# Patient Record
Sex: Female | Born: 1980
Health system: Southern US, Community
[De-identification: ages and names within clinical notes are randomized; demographics above are authoritative.]

## PROBLEM LIST (undated history)

## (undated) DIAGNOSIS — G43909 Migraine, unspecified, not intractable, without status migrainosus: Secondary | ICD-10-CM

## (undated) DIAGNOSIS — F419 Anxiety disorder, unspecified: Secondary | ICD-10-CM

## (undated) DIAGNOSIS — IMO0002 Reserved for concepts with insufficient information to code with codable children: Secondary | ICD-10-CM

## (undated) DIAGNOSIS — O24419 Gestational diabetes mellitus in pregnancy, unspecified control: Secondary | ICD-10-CM

## (undated) HISTORY — DX: Migraine, unspecified, not intractable, without status migrainosus: G43.909

## (undated) HISTORY — DX: Gestational diabetes mellitus in pregnancy, unspecified control: O24.419

## (undated) HISTORY — PX: WISDOM TOOTH EXTRACTION: SHX21

## (undated) HISTORY — DX: Reserved for concepts with insufficient information to code with codable children: IMO0002

---
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
## 2002-10-05 ENCOUNTER — Emergency Department (HOSPITAL_COMMUNITY): Admission: EM | Admit: 2002-10-05 | Discharge: 2002-10-06 | Payer: Self-pay | Admitting: Emergency Medicine

## 2002-10-05 ENCOUNTER — Encounter: Payer: Self-pay | Admitting: Emergency Medicine

## 2003-01-19 ENCOUNTER — Encounter: Payer: Self-pay | Admitting: *Deleted

## 2003-01-19 ENCOUNTER — Encounter: Payer: Self-pay | Admitting: Emergency Medicine

## 2003-01-19 ENCOUNTER — Emergency Department (HOSPITAL_COMMUNITY): Admission: EM | Admit: 2003-01-19 | Discharge: 2003-01-19 | Payer: Self-pay

## 2003-10-30 ENCOUNTER — Emergency Department (HOSPITAL_COMMUNITY): Admission: EM | Admit: 2003-10-30 | Discharge: 2003-10-30 | Payer: Self-pay | Admitting: Emergency Medicine

## 2003-11-12 ENCOUNTER — Emergency Department (HOSPITAL_COMMUNITY): Admission: EM | Admit: 2003-11-12 | Discharge: 2003-11-12 | Payer: Self-pay | Admitting: *Deleted

## 2003-12-06 ENCOUNTER — Emergency Department (HOSPITAL_COMMUNITY): Admission: EM | Admit: 2003-12-06 | Discharge: 2003-12-06 | Payer: Self-pay | Admitting: Emergency Medicine

## 2004-01-18 ENCOUNTER — Inpatient Hospital Stay (HOSPITAL_COMMUNITY): Admission: AD | Admit: 2004-01-18 | Discharge: 2004-01-19 | Payer: Self-pay | Admitting: Obstetrics and Gynecology

## 2004-01-22 ENCOUNTER — Emergency Department (HOSPITAL_COMMUNITY): Admission: EM | Admit: 2004-01-22 | Discharge: 2004-01-22 | Payer: Self-pay | Admitting: Emergency Medicine

## 2004-01-24 ENCOUNTER — Emergency Department (HOSPITAL_COMMUNITY): Admission: EM | Admit: 2004-01-24 | Discharge: 2004-01-24 | Payer: Self-pay

## 2004-02-27 ENCOUNTER — Emergency Department (HOSPITAL_COMMUNITY): Admission: EM | Admit: 2004-02-27 | Discharge: 2004-02-27 | Payer: Self-pay | Admitting: Emergency Medicine

## 2004-04-13 ENCOUNTER — Inpatient Hospital Stay (HOSPITAL_COMMUNITY): Admission: AD | Admit: 2004-04-13 | Discharge: 2004-04-13 | Payer: Self-pay | Admitting: *Deleted

## 2004-05-01 ENCOUNTER — Inpatient Hospital Stay (HOSPITAL_COMMUNITY): Admission: AD | Admit: 2004-05-01 | Discharge: 2004-05-01 | Payer: Self-pay | Admitting: Obstetrics

## 2004-05-08 ENCOUNTER — Inpatient Hospital Stay (HOSPITAL_COMMUNITY): Admission: AD | Admit: 2004-05-08 | Discharge: 2004-05-09 | Payer: Self-pay | Admitting: Obstetrics

## 2004-06-13 ENCOUNTER — Inpatient Hospital Stay (HOSPITAL_COMMUNITY): Admission: AD | Admit: 2004-06-13 | Discharge: 2004-06-13 | Payer: Self-pay | Admitting: *Deleted

## 2004-10-18 ENCOUNTER — Inpatient Hospital Stay (HOSPITAL_COMMUNITY): Admission: AD | Admit: 2004-10-18 | Discharge: 2004-10-19 | Payer: Self-pay | Admitting: Obstetrics

## 2004-11-03 ENCOUNTER — Inpatient Hospital Stay (HOSPITAL_COMMUNITY): Admission: AD | Admit: 2004-11-03 | Discharge: 2004-11-03 | Payer: Self-pay | Admitting: Obstetrics

## 2004-12-10 ENCOUNTER — Inpatient Hospital Stay (HOSPITAL_COMMUNITY): Admission: AD | Admit: 2004-12-10 | Discharge: 2004-12-13 | Payer: Self-pay | Admitting: Obstetrics

## 2004-12-10 ENCOUNTER — Inpatient Hospital Stay (HOSPITAL_COMMUNITY): Admission: AD | Admit: 2004-12-10 | Discharge: 2004-12-10 | Payer: Self-pay | Admitting: Obstetrics

## 2005-10-23 ENCOUNTER — Inpatient Hospital Stay (HOSPITAL_COMMUNITY): Admission: AD | Admit: 2005-10-23 | Discharge: 2005-10-23 | Payer: Self-pay | Admitting: Gynecology

## 2005-10-29 ENCOUNTER — Emergency Department (HOSPITAL_COMMUNITY): Admission: EM | Admit: 2005-10-29 | Discharge: 2005-10-29 | Payer: Self-pay | Admitting: Emergency Medicine

## 2006-01-01 ENCOUNTER — Inpatient Hospital Stay (HOSPITAL_COMMUNITY): Admission: AD | Admit: 2006-01-01 | Discharge: 2006-01-01 | Payer: Self-pay | Admitting: Obstetrics and Gynecology

## 2006-01-10 ENCOUNTER — Other Ambulatory Visit: Admission: RE | Admit: 2006-01-10 | Discharge: 2006-01-10 | Payer: Self-pay | Admitting: Obstetrics and Gynecology

## 2006-02-18 ENCOUNTER — Emergency Department (HOSPITAL_COMMUNITY): Admission: EM | Admit: 2006-02-18 | Discharge: 2006-02-18 | Payer: Self-pay | Admitting: Emergency Medicine

## 2006-05-24 ENCOUNTER — Inpatient Hospital Stay (HOSPITAL_COMMUNITY): Admission: AD | Admit: 2006-05-24 | Discharge: 2006-05-26 | Payer: Self-pay | Admitting: Obstetrics and Gynecology

## 2007-11-13 ENCOUNTER — Emergency Department (HOSPITAL_COMMUNITY): Admission: EM | Admit: 2007-11-13 | Discharge: 2007-11-13 | Payer: Self-pay | Admitting: Emergency Medicine

## 2007-11-30 DIAGNOSIS — IMO0002 Reserved for concepts with insufficient information to code with codable children: Secondary | ICD-10-CM

## 2007-11-30 HISTORY — DX: Reserved for concepts with insufficient information to code with codable children: IMO0002

## 2008-02-11 ENCOUNTER — Other Ambulatory Visit: Admission: RE | Admit: 2008-02-11 | Discharge: 2008-02-11 | Payer: Self-pay | Admitting: Gynecology

## 2008-03-08 ENCOUNTER — Ambulatory Visit: Payer: Self-pay | Admitting: Gynecology

## 2008-03-08 HISTORY — PX: OTHER SURGICAL HISTORY: SHX169

## 2008-03-09 ENCOUNTER — Ambulatory Visit: Payer: Self-pay | Admitting: Gynecology

## 2008-12-28 ENCOUNTER — Ambulatory Visit: Payer: Self-pay | Admitting: Gynecology

## 2009-02-13 ENCOUNTER — Ambulatory Visit: Payer: Self-pay | Admitting: Gynecology

## 2009-02-13 ENCOUNTER — Encounter: Payer: Self-pay | Admitting: Gynecology

## 2009-02-13 ENCOUNTER — Other Ambulatory Visit: Admission: RE | Admit: 2009-02-13 | Discharge: 2009-02-13 | Payer: Self-pay | Admitting: Gynecology

## 2009-07-05 ENCOUNTER — Emergency Department (HOSPITAL_COMMUNITY): Admission: EM | Admit: 2009-07-05 | Discharge: 2009-07-05 | Payer: Self-pay | Admitting: Family Medicine

## 2010-02-15 ENCOUNTER — Ambulatory Visit: Payer: Self-pay | Admitting: Gynecology

## 2010-02-15 ENCOUNTER — Other Ambulatory Visit: Admission: RE | Admit: 2010-02-15 | Discharge: 2010-02-15 | Payer: Self-pay | Admitting: Gynecology

## 2011-02-20 ENCOUNTER — Ambulatory Visit (INDEPENDENT_AMBULATORY_CARE_PROVIDER_SITE_OTHER): Payer: BC Managed Care – PPO | Admitting: Women's Health

## 2011-02-20 ENCOUNTER — Other Ambulatory Visit (HOSPITAL_COMMUNITY)
Admission: RE | Admit: 2011-02-20 | Discharge: 2011-02-20 | Disposition: A | Discharge status: Home / Self Care | Payer: BC Managed Care – PPO | Source: Ambulatory Visit | Attending: Women's Health | Admitting: Women's Health

## 2011-02-20 ENCOUNTER — Encounter: Payer: Self-pay | Admitting: Women's Health

## 2011-02-20 VITALS — BP 110/70 | Ht 64.0 in | Wt 138.0 lb

## 2011-02-20 DIAGNOSIS — Z833 Family history of diabetes mellitus: Secondary | ICD-10-CM

## 2011-02-20 DIAGNOSIS — R82998 Other abnormal findings in urine: Secondary | ICD-10-CM

## 2011-02-20 DIAGNOSIS — R87810 Cervical high risk human papillomavirus (HPV) DNA test positive: Secondary | ICD-10-CM | POA: Insufficient documentation

## 2011-02-20 DIAGNOSIS — Z01419 Encounter for gynecological examination (general) (routine) without abnormal findings: Secondary | ICD-10-CM

## 2011-02-20 DIAGNOSIS — Z113 Encounter for screening for infections with a predominantly sexual mode of transmission: Secondary | ICD-10-CM

## 2011-02-20 DIAGNOSIS — Z124 Encounter for screening for malignant neoplasm of cervix: Secondary | ICD-10-CM | POA: Insufficient documentation

## 2011-02-20 NOTE — Progress Notes (Signed)
Misty Russell 1980/09/22 [id number]    History:    The patient presents for annual exam.  Works at the [employer] no complaints today.   Past medical history, past surgical history, family history and social history were all reviewed and documented in the EPIC chart.   ROS:  A  ROS was performed and pertinent positives and negatives are included in the history.  Exam:  Filed Vitals:   02/20/11 1556  BP: 110/70    General appearance:  Normal Head/Neck:  Normal, without cervical or supraclavicular adenopathy. Thyroid:  Symmetrical, normal in size, without palpable masses or nodularity. Respiratory  Effort:  Normal  Auscultation:  Clear without wheezing or rhonchi Cardiovascular  Auscultation:  Regular rate, without rubs, murmurs or gallops  Edema/varicosities:  Not grossly evident Abdominal  Soft,nontender, without masses, guarding or rebound.  Liver/spleen:  No organomegaly noted  Hernia:  None appreciated  Skin  Inspection:  Grossly normal  Palpation:  Grossly normal/implanon-rt upper arm Neurologic/psychiatric  Orientation:  Normal with appropriate conversation.  Mood/affect:  Normal  Genitourinary    Breasts: Examined lying and sitting.     Right: Without masses, retractions, discharge or axillary adenopathy.     Left: Without masses, retractions, discharge or axillary adenopathy.   Inguinal/mons:  Normal without inguinal adenopathy  External genitalia:  Normal  BUS/Urethra/Skene's glands:  Normal  Bladder:  Normal  Vagina:  Normal  Cervix:  Normal  Uterus:   normal in size, shape and contour.  Midline and mobile  Adnexa/parametria:     Rt: Without masses or tenderness.   Lt: Without masses or tenderness.  Anus and perineum: Normal  Digital rectal exam: Normal sphincter tone without palpated masses or tenderness  Assessment/Plan:  30 y.o. Separated BF G2P2 for annual exam with no complaints. Implanon was placed September 15 09, monthly 6 day cycle.  Contraception options were reviewed, would like to try to Mirena IUD. Reviewed risks of infection perforation hemorrhage, will check insurance coverage and schedule with Dr. Lily Peer to have placed and to remove the Implanon.  She has a new partner.  CBC, glucose, UA, Pap, GC/ Chlamydia, HIV, hepatitis B and C., and and RPR. SBEs, exercise, MVI, calcium rich diet encouraged. Will return to the office for Mirena IUD and Implanon removal with Dr. Lily Peer.    Harrington Challenger [facility], 5:08 PM 02/20/2011

## 2011-02-21 ENCOUNTER — Telehealth: Payer: Self-pay | Admitting: Women's Health

## 2011-02-21 LAB — HEPATITIS C ANTIBODY: HCV Ab: NEGATIVE

## 2011-02-21 LAB — RPR

## 2011-02-21 LAB — HEPATITIS B SURFACE ANTIGEN: Hepatitis B Surface Ag: NEGATIVE

## 2011-02-21 NOTE — Telephone Encounter (Signed)
Left message on cell that all her labs were normal or negative. Hemoglobin/ hematocrit slightly low 11.9 and 34.7. Did review importance of woman's One-A-Day vitamin, increasing iron rich foods. Instructed to call if questions.

## 2011-02-27 ENCOUNTER — Ambulatory Visit: Payer: BC Managed Care – PPO | Admitting: Gynecology

## 2011-02-28 ENCOUNTER — Telehealth: Payer: Self-pay | Admitting: Women's Health

## 2011-02-28 NOTE — Telephone Encounter (Signed)
Patient was notified about Pap with ASCUS positive high-risk HPV, C&B.was scheduled.

## 2011-03-06 ENCOUNTER — Encounter: Payer: Self-pay | Admitting: Women's Health

## 2011-03-07 ENCOUNTER — Ambulatory Visit: Payer: BC Managed Care – PPO | Admitting: Gynecology

## 2011-03-08 ENCOUNTER — Ambulatory Visit: Payer: BC Managed Care – PPO | Admitting: Gynecology

## 2011-03-08 ENCOUNTER — Encounter: Payer: Self-pay | Admitting: Gynecology

## 2011-03-08 ENCOUNTER — Ambulatory Visit (INDEPENDENT_AMBULATORY_CARE_PROVIDER_SITE_OTHER): Payer: BC Managed Care – PPO | Admitting: Gynecology

## 2011-03-08 VITALS — BP 116/72

## 2011-03-08 DIAGNOSIS — Z30431 Encounter for routine checking of intrauterine contraceptive device: Secondary | ICD-10-CM

## 2011-03-08 DIAGNOSIS — Z3046 Encounter for surveillance of implantable subdermal contraceptive: Secondary | ICD-10-CM

## 2011-03-08 DIAGNOSIS — Z3049 Encounter for surveillance of other contraceptives: Secondary | ICD-10-CM

## 2011-03-08 HISTORY — PX: INTRAUTERINE DEVICE INSERTION: SHX323

## 2011-03-08 MED ORDER — LEVONORGESTREL 20 MCG/24HR IU IUD: INTRAUTERINE_SYSTEM | Freq: Once | INTRAUTERINE | Status: DC

## 2011-03-08 NOTE — Progress Notes (Signed)
Patient is a 30 year old gravida 2 para 2 who presented to the office today to remove her Implanon from her left arm which was placed approximately 3 years ago. Patient was interested in proceeding with a different form of contraception such as with a Mirena IUD which she had received read the information which she was last here in the office. Consent form was signed today. The risks benefits and pros and cons of the Mirena IUD were discussed. Patient fully understands that this form of contraception is good for 5 years and is 99% effective and reversible.  Procedure #1 removal of Implanon The patient was in the supine position with her left arm at 90 identification of the previously placed Implanon was noted. The site for the incision was to be made Betadine solution was applied as an antiseptic. One percent lidocaine was infiltrated under the implant to keep a close to the skin surface. Countertraction on the proximal end of the implant was made in a longitudinal incision of 2 mm from the tip of the implant towards the elbow was made. Pushing the implant towards the incision was made until the tip was visible. The implant was then grasped with a forcep (hemostat) and gently the implant was removed. The implant was inspected and was 4 cm long shown to the patient and discarded. The incision was then closed with a Steri-Strip and a Kerlix wrap was placed for patient to remove tomorrow.  Procedure #2 insertion of Mirena IUD The patient was then stirrups and a bimanual examination demonstrated a uterus to be anteverted normal size shape and consistency with no palpable adnexal masses. The vagina and cervix was cleansed with Betadine solution. A single-tooth tenaculum was placed on the anterior cervical lip. The uterus sounded to 7 cm. The Mirena IUD was placed in a sterile fashion. The single-tooth tenaculum was removed and the speculum.  Patient tolerated both procedures well. She will return to the office  in one month for followup.

## 2011-03-11 ENCOUNTER — Encounter: Payer: Self-pay | Admitting: Gynecology

## 2011-03-12 ENCOUNTER — Ambulatory Visit (INDEPENDENT_AMBULATORY_CARE_PROVIDER_SITE_OTHER): Payer: BC Managed Care – PPO | Admitting: Gynecology

## 2011-03-12 ENCOUNTER — Encounter: Payer: Self-pay | Admitting: Gynecology

## 2011-03-12 VITALS — BP 110/70

## 2011-03-12 DIAGNOSIS — R87610 Atypical squamous cells of undetermined significance on cytologic smear of cervix (ASC-US): Secondary | ICD-10-CM

## 2011-03-12 DIAGNOSIS — R87611 Atypical squamous cells cannot exclude high grade squamous intraepithelial lesion on cytologic smear of cervix (ASC-H): Secondary | ICD-10-CM

## 2011-03-12 DIAGNOSIS — Z30431 Encounter for routine checking of intrauterine contraceptive device: Secondary | ICD-10-CM

## 2011-03-12 NOTE — Progress Notes (Signed)
Patient is a 30 year old who on August 30 at her Pap smear which had demonstrated atypical squamous cells of undetermined significance high-risk HPV had been detected. Patient did in the office 4 days ago and had the Implanon removed from her arm in a Mirena IUD had been placed. We went through a detailed discussion of the diagnosis and management of abnormal Pap smears and new guidelines.  Colposcopic evaluation:  Bartholin urethra Skene was within normal limits Near the area of the fourchette there appears to be a condylomatous-like growth Cervix: IUD string was seen and was trimmed. The area where the single-tooth tenaculum was placed to 4 days ago began to bleed and silver nitrate was placed to contain it which obscured the cervix from complete evaluation.  We'll have the patient return back in one month for the completion of the colposcopic evaluation and possible consideration of biopsying the are of the fourchette as well and manage accordingly. All this was going to the patient. Literature information was provided.

## 2011-03-12 NOTE — Patient Instructions (Signed)
Patient information: Management of atypical squamous cells (ASC-US and ASC-H) and low grade cervical squamous intraepithelial lesions (LSIL) (Beyond the Basics)  Authors Clydene Pugh, MD Thayer Ohm, MD Section Editor Alvera Novel, MD Deputy Editor Morton Amy, MD Disclosures  All topics are updated as new evidence becomes available and our peer review process is complete.  Literature review current through: Aug 2012.  This topic last updated: Oct 03, 2010.  INTRODUCTION - Squamous cells make up the outer layer of the cervix and vagina). Atypical squamous cells (ASC) is the name given to squamous cells on a Pap smear or cervical cytology that do not have a normal appearance but are not clearly precancerous. Low grade squamous intraepithelial lesions (LSIL, also called low grade cervical intraepithelial neoplasia) refers to cells that appear slightly abnormal. Women who have ASC or LSIL require further testing because some women with these findings have a precancerous lesion of the cervix. This topic review discusses the management of women with ASC and LSIL. The management of women with high grade squamous intraepithelial lesions (HSIL) and atypical glandular cells (AGC) are discussed in a separate topic review.Marland Kitchen) ATYPICAL SQUAMOUS CELLS (ASC) - ASC is subdivided into atypical squamous cells of undetermined significance (ASC-US) and atypical squamous cells, cannot rule out a high grade lesion (ASC-H). The risk of a high-grade precancerous lesion in women with ASC-US is 15 percent and for those with ASC-H, the risk is 38 percent [1]. Atypical squamous cells of undetermined significance (ASC-US) - In women older than 20 years, there are three options for evaluation of a single ASC-US result. Women who are pregnant or younger than age 55 years are evaluated differently (see 'Adolescents' below and 'Pregnant women' below). Perform HPV testing. This is the preferred follow up for  ASC-US. HPV testing is often done at the same time as the Pap smear. This is convenient because the woman does not have to return for a second visit. HPV testing is described in detail in a separate topic review Women who test positive for HPV types that are high risk for cervical cancer should have colposcopy because they are at greater risk of having an underlying precancerous lesion.  Women who test negative for HPV are not likely to have cervical precancer. These women should have a repeat Pap smear in one year. In most cases, the ASC-US resolves during this time.  Repeat the Pap smear in six months. If this test is normal, it is repeated once more after another six months until there have been two normal tests in a row; the woman can then return to routine screening. If the woman has a second ASC-US result or a more severe abnormality develops, colposcopy is recommended.   Have colposcopy. Atypical squamous cells, cannot rule out a high grade lesion (ASC-H) - ASC-H is more likely than ASC-US to be caused by a precancerous change. This finding requires further evaluation with colposcopy LOW-GRADE SQUAMOUS LESION (LSIL) - LSIL is usually caused by mild cellular changes. Further testing with colposcopy and cervical biopsy is almost always recommended for women with LSIL because 12 to 16 percent of women with LSIL have a precancerous lesion [2,3]. However, adolescents and postmenopausal women are evaluated somewhat differently  Pregnant women are evaluated similarly to non-pregnant women but are also discussed separately below. The management of women with LSIL depends upon what is seen with colposcopy and biopsymost clinicians will delay biopsy until after delivery in pregnant women COLPOSCOPY - Colposcopy is an office procedure that  allows a clinician to closely examine the cervix. It is commonly performed after an abnormal Pap smear. Colposcopy is performed similar to a pelvic examination, while the  woman lies on an exam table. A speculum is used to view the cervix, and the viewing device (called a colposcope) remains outside the woman's body. The colposcope magnifies the appearance of the cervix. This allows the clinician to better see the location and size of any abnormalities, and also to see any changes in the capillaries (small blood vessels) on the surface of the cervix.  During colposcopy, a small piece of the abnormal area can be removed (biopsied). Anesthesia (numbing medicine) is not needed because the biopsy causes only mild discomfort or cramping. Some women also need to have a biopsy of the inner cervix during colposcopy; this is called endocervical curettage (ECC). Endocervix refers to the inner cervix and curettage means scraping. Pregnant women should not have ECC because it may disturb the pregnancy. Management after colposcopy - Most women who have a colposcopy have a biopsy of any abnormal-appearing areas. The biopsy samples are sent to a pathologist who determines if there is any evidence of precancerous changes, termed cervical intraepithelial neoplasia (CIN). These changes are categorized as being mild (CIN 1) or moderate to severe (CIN 2 or 3). CIN 1 biopsy in women with Pap smear results that were ASC-US, ASC-H or LSIL cytology - Follow-up is recommended with either HPV testing at 12 months or a Pap smear at 6 and 12 months. The reason for this recommendation is that CIN I is a minor abnormality that usually goes away over time without treatment. Waiting and repeating testing allows time for the abnormality to resolve and also enables the healthcare provider to identify the few situations in which the abnormality has become more severe. Repeat colposcopy is recommended if the results of the follow-up Pap smear are ASC or greater or if the HPV test is positive. Women with two consecutive negative repeat cytology results or a negative HPV test can resume routine screening.  CIN  biopsy in women with Pap smear results that were high-grade SIL (HSIL) or atypical glandular cells-not otherwise specified - Follow-up can be one of three options: (1) Pap smear and colposcopy every six months for a year; (2) re-review of both Pap smear and biopsy results by a pathologist; or (3) a procedure to remove a larger piece of tissue from the cervix (cone biopsy or loop electrosurgical excision procedure [called LEEP, loop, or LLETZ]).  CIN 2 or 3 - CIN 2 or 3 is usually treated by removing or destroying the abnormal area (using a cone biopsy, LEEP, laser, or freezing procedure). The reason for this recommendation is that moderate to severe precancerous abnormalities (CIN 2 or 3) are unlikely to resolve over time without treatment and may progress to cancer if left untreated over a period of years.  However, adolescents and pregnant women are often able to delay treatmentSPECIAL CIRCUMSTANCES Postmenopausal women - In postmenopausal women, LSIL may be evaluated differently because thinning and drying of the tissues (referred to as atrophy) can cause the cells to appear abnormal. These changes often resolve with time and are often not related to changes caused by HPV. Options for postmenopausal women with LSIL include the following: Colposcopy  HPV testing  Repeat Pap smear at 6 and 12 months If the HPV testing or repeat Pap smear tests are negative, the woman may return to routine testing. If the HPV test or repeat Pap smear are  abnormal (ASC or greater), colposcopy is recommended. The management of postmenopausal women after colposcopy is discussed aboveAdolescents - In adolescent women (age 18 years or younger), abnormal Pap smear is often approached differently because, in this age group, there is a good chance that the abnormal area will resolve over time, without treatment. There is a high rate of HPV infection in this group, but a very low rate of cervical cancer. ASC-US, LSIL, and/or CIN 1 -  Adolescents with ASC-US, LSIL, and/or CIN 1 are often advised to have repeat Pap smear in 12 months. HPV testing is not recommended because it is likely to be positive and would not affect the recommendation to repeat the test in 12 months. If the 12 month cytology shows ASC-US, ASC-H, or LSIL, the test is usually repeated 12 months later (at 24 months).  If the 12 month cytology shows HSIL or worse, the adolescent is usually advised to have colposcopy If the 24 month test is abnormal (ASC-US or greater), the adolescent is usually advised to have colposcopy. If the 24 month test is normal, Pap smear is recommended once yearly. High grade lesions (CIN 2 or 3) - Adolescents with HSIL should undergo colposcopy. If cervical biopsy does NOT show HSIL, they can be followed with colposcopy and Pap smear every six months for two years. If cervical biopsy confirms HSIL, they can either be followed with Pap smear and colposcopy or HPV testing until they have had normal testing for one year. If these follow-up results are normal, they can resume routine screening. If follow-up testing shows abnormalities or the cervix cannot be fully evaluated, they will need further testing or removal of a part of the cervix (cone biopsy or LEEP).Pregnant women - The evaluation and management of pregnant women is different from non-pregnant women because of the risk that trauma to the cervix could lead to preterm labor or delivery. ASC-US - Pregnant women with ASC-US and a positive HPV test may elect to have colposcopy during pregnancy or wait until at least six weeks after delivering their baby. The reason for this recommendation is that cervix appears somewhat different during pregnancy, which can make it difficult to determine if an area appears abnormal due to pregnancy or due to precancerous changes. In addition, most mild abnormalities resolve over time without treatment.  ASC-H - Pregnant women with ASC-H should have a  colposcopy. This is because ASC-H is more likely than ASC-US to be caused by a precancerous change.  LSIL - Colposcopy is recommended for pregnant women with LSIL, similar to non-pregnant women.  WHERE TO GET MORE INFORMATION - Your healthcare provider is the best source of information for questions and concerns related to your medical problem.

## 2011-03-20 LAB — POCT PREGNANCY, URINE
Operator id: 203781
Preg Test, Ur: NEGATIVE

## 2011-06-15 ENCOUNTER — Ambulatory Visit (INDEPENDENT_AMBULATORY_CARE_PROVIDER_SITE_OTHER): Payer: BC Managed Care – PPO

## 2011-06-15 DIAGNOSIS — R51 Headache: Secondary | ICD-10-CM

## 2011-06-15 DIAGNOSIS — R110 Nausea: Secondary | ICD-10-CM

## 2011-06-27 ENCOUNTER — Ambulatory Visit (INDEPENDENT_AMBULATORY_CARE_PROVIDER_SITE_OTHER): Payer: BC Managed Care – PPO

## 2011-06-27 DIAGNOSIS — F341 Dysthymic disorder: Secondary | ICD-10-CM

## 2011-06-27 DIAGNOSIS — D649 Anemia, unspecified: Secondary | ICD-10-CM

## 2012-04-23 ENCOUNTER — Encounter: Payer: Self-pay | Admitting: Women's Health

## 2012-04-24 ENCOUNTER — Other Ambulatory Visit (HOSPITAL_COMMUNITY)
Admission: RE | Admit: 2012-04-24 | Discharge: 2012-04-24 | Disposition: A | Discharge status: Home / Self Care | Payer: BC Managed Care – PPO | Source: Ambulatory Visit | Attending: Women's Health | Admitting: Women's Health

## 2012-04-24 ENCOUNTER — Encounter: Payer: Self-pay | Admitting: Women's Health

## 2012-04-24 ENCOUNTER — Ambulatory Visit (INDEPENDENT_AMBULATORY_CARE_PROVIDER_SITE_OTHER): Payer: BC Managed Care – PPO | Admitting: Women's Health

## 2012-04-24 VITALS — BP 122/70 | Ht 64.0 in | Wt 134.0 lb

## 2012-04-24 DIAGNOSIS — R87810 Cervical high risk human papillomavirus (HPV) DNA test positive: Secondary | ICD-10-CM | POA: Insufficient documentation

## 2012-04-24 DIAGNOSIS — N879 Dysplasia of cervix uteri, unspecified: Secondary | ICD-10-CM | POA: Insufficient documentation

## 2012-04-24 DIAGNOSIS — Z01419 Encounter for gynecological examination (general) (routine) without abnormal findings: Secondary | ICD-10-CM

## 2012-04-24 DIAGNOSIS — N898 Other specified noninflammatory disorders of vagina: Secondary | ICD-10-CM

## 2012-04-24 DIAGNOSIS — Z1151 Encounter for screening for human papillomavirus (HPV): Secondary | ICD-10-CM | POA: Insufficient documentation

## 2012-04-24 LAB — CBC WITH DIFFERENTIAL/PLATELET
Basophils Absolute: 0 10*3/uL (ref 0.0–0.1)
Basophils Relative: 0 % (ref 0–1)
Eosinophils Absolute: 0.2 10*3/uL (ref 0.0–0.7)
Eosinophils Relative: 2 % (ref 0–5)
HCT: 38.6 % (ref 36.0–46.0)
MCH: 29.4 pg (ref 26.0–34.0)
MCHC: 34.5 g/dL (ref 30.0–36.0)
MCV: 85.4 fL (ref 78.0–100.0)
Monocytes Absolute: 0.4 10*3/uL (ref 0.1–1.0)
Platelets: 292 10*3/uL (ref 150–400)
RDW: 13.9 % (ref 11.5–15.5)
WBC: 7.8 10*3/uL (ref 4.0–10.5)

## 2012-04-24 LAB — WET PREP FOR TRICH, YEAST, CLUE
Trich, Wet Prep: NONE SEEN
Yeast Wet Prep HPF POC: NONE SEEN

## 2012-04-24 MED ORDER — METRONIDAZOLE 0.75 % VA GEL: VAGINAL | Status: DC
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
## 2012-04-24 NOTE — Patient Instructions (Addendum)

## 2012-04-25 LAB — URINALYSIS W MICROSCOPIC + REFLEX CULTURE
Bacteria, UA: NONE SEEN
Bilirubin Urine: NEGATIVE
Crystals: NONE SEEN
Hgb urine dipstick: NEGATIVE
Ketones, ur: NEGATIVE mg/dL
Nitrite: NEGATIVE
Urobilinogen, UA: 1 mg/dL (ref 0.0–1.0)

## 2012-05-12 ENCOUNTER — Ambulatory Visit: Payer: BC Managed Care – PPO | Admitting: Gynecology

## 2012-12-08 ENCOUNTER — Emergency Department (HOSPITAL_COMMUNITY)
Admission: EM | Admit: 2012-12-08 | Discharge: 2012-12-08 | Disposition: A | Discharge status: Home / Self Care | Payer: BC Managed Care – PPO | Attending: Emergency Medicine | Admitting: Emergency Medicine

## 2012-12-08 ENCOUNTER — Encounter (HOSPITAL_COMMUNITY): Payer: Self-pay | Admitting: *Deleted

## 2012-12-08 DIAGNOSIS — K089 Disorder of teeth and supporting structures, unspecified: Secondary | ICD-10-CM | POA: Insufficient documentation

## 2012-12-08 DIAGNOSIS — IMO0001 Reserved for inherently not codable concepts without codable children: Secondary | ICD-10-CM | POA: Insufficient documentation

## 2012-12-08 DIAGNOSIS — R112 Nausea with vomiting, unspecified: Secondary | ICD-10-CM | POA: Insufficient documentation

## 2012-12-08 DIAGNOSIS — R220 Localized swelling, mass and lump, head: Secondary | ICD-10-CM | POA: Insufficient documentation

## 2012-12-08 DIAGNOSIS — IMO0002 Reserved for concepts with insufficient information to code with codable children: Secondary | ICD-10-CM | POA: Insufficient documentation

## 2012-12-08 DIAGNOSIS — R221 Localized swelling, mass and lump, neck: Secondary | ICD-10-CM | POA: Insufficient documentation

## 2012-12-08 DIAGNOSIS — K0889 Other specified disorders of teeth and supporting structures: Secondary | ICD-10-CM

## 2012-12-08 DIAGNOSIS — R51 Headache: Secondary | ICD-10-CM | POA: Insufficient documentation

## 2012-12-08 DIAGNOSIS — K029 Dental caries, unspecified: Secondary | ICD-10-CM | POA: Insufficient documentation

## 2012-12-08 MED ORDER — PENICILLIN V POTASSIUM 500 MG PO TABS: 500.0000 mg | ORAL_TABLET | Freq: Four times a day (QID) | ORAL | Status: AC

## 2012-12-08 MED ORDER — OXYCODONE-ACETAMINOPHEN 5-325 MG PO TABS
2.0000 | ORAL_TABLET | Freq: Once | ORAL | Status: AC
  Administered 2012-12-08: 2 via ORAL
  Filled 2012-12-08: qty 2

## 2012-12-08 MED ORDER — HYDROCODONE-ACETAMINOPHEN 5-325 MG PO TABS: 1.0000 | ORAL_TABLET | Freq: Four times a day (QID) | ORAL | Status: DC | PRN

## 2012-12-08 MED ORDER — ONDANSETRON 8 MG PO TBDP
8.0000 mg | ORAL_TABLET | Freq: Once | ORAL | Status: AC
  Administered 2012-12-08: 8 mg via ORAL
  Filled 2012-12-08: qty 1

## 2012-12-08 NOTE — ED Notes (Signed)
Pt states that she began to have dental pain to rt side of mouth around 11:30am today; pt states that after the pain started she began to have nausea; pt reports that she has vomited x 3.
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
## 2012-12-11 NOTE — ED Provider Notes (Signed)
Medical screening examination/treatment/procedure(s) were performed by non-physician practitioner and as supervising physician I was immediately available for consultation/collaboration.   Dione Booze, MD 12/11/12 [phone]

## 2013-04-30 ENCOUNTER — Other Ambulatory Visit (HOSPITAL_COMMUNITY): Admission: RE | Admit: 2013-04-30 | Payer: BC Managed Care – PPO | Source: Ambulatory Visit

## 2013-04-30 ENCOUNTER — Other Ambulatory Visit (HOSPITAL_COMMUNITY)
Admission: RE | Admit: 2013-04-30 | Discharge: 2013-04-30 | Disposition: A | Discharge status: Home / Self Care | Payer: BC Managed Care – PPO | Source: Ambulatory Visit | Attending: Women's Health | Admitting: Women's Health

## 2013-04-30 ENCOUNTER — Encounter: Payer: Self-pay | Admitting: Women's Health

## 2013-04-30 ENCOUNTER — Ambulatory Visit (INDEPENDENT_AMBULATORY_CARE_PROVIDER_SITE_OTHER): Payer: BC Managed Care – PPO | Admitting: Women's Health

## 2013-04-30 VITALS — BP 106/78 | Ht 64.0 in | Wt 134.0 lb

## 2013-04-30 DIAGNOSIS — Z01419 Encounter for gynecological examination (general) (routine) without abnormal findings: Secondary | ICD-10-CM | POA: Insufficient documentation

## 2013-04-30 DIAGNOSIS — R21 Rash and other nonspecific skin eruption: Secondary | ICD-10-CM

## 2013-04-30 DIAGNOSIS — Z833 Family history of diabetes mellitus: Secondary | ICD-10-CM

## 2013-04-30 DIAGNOSIS — Z1151 Encounter for screening for human papillomavirus (HPV): Secondary | ICD-10-CM | POA: Insufficient documentation

## 2013-04-30 LAB — CBC WITH DIFFERENTIAL/PLATELET
Eosinophils Absolute: 0.1 10*3/uL (ref 0.0–0.7)
Hemoglobin: 13.4 g/dL (ref 12.0–15.0)
Lymphocytes Relative: 40 % (ref 12–46)
Lymphs Abs: 2.5 10*3/uL (ref 0.7–4.0)
MCH: 30.3 pg (ref 26.0–34.0)
Monocytes Relative: 7 % (ref 3–12)
Neutrophils Relative %: 51 % (ref 43–77)
RBC: 4.42 MIL/uL (ref 3.87–5.11)
WBC: 6.4 10*3/uL (ref 4.0–10.5)

## 2013-04-30 MED ORDER — NYSTATIN-TRIAMCINOLONE 100000-0.1 UNIT/GM-% EX OINT: 1.0000 "application " | TOPICAL_OINTMENT | Freq: Two times a day (BID) | CUTANEOUS | Status: DC

## 2013-04-30 NOTE — Addendum Note (Signed)
Addended by: Bertram Savin A on: 04/30/2013 01:57 PM   Modules accepted: Orders

## 2013-04-30 NOTE — Patient Instructions (Signed)

## 2013-04-30 NOTE — Progress Notes (Signed)
Misty Russell 07-19-1980 [id number]    History:    The patient presents for annual exam.  Light monthly cycle Mirena IUD placed 02/2011. 2012 ascus with positive HR HPV without followup, 2013 normal Pap with positive HR HPV without followup.   Past medical history, past surgical history, family history and social history were all reviewed and documented in the EPIC chart. Works for an P[employer] consultant. Misty Russell 8, Misty Russell 7 both doing well. Mother, grandfather diabetes.   ROS:  A  ROS was performed and pertinent positives and negatives are included in the history.  Exam:  Filed Vitals:   04/30/13 1109  BP: 106/78    General appearance:  Normal Head/Neck:  Normal, without cervical or supraclavicular adenopathy. Thyroid:  Symmetrical, normal in size, without palpable masses or nodularity. Respiratory  Effort:  Normal  Auscultation:  Clear without wheezing or rhonchi Cardiovascular  Auscultation:  Regular rate, without rubs, murmurs or gallops  Edema/varicosities:  Not grossly evident Abdominal  Soft,nontender, without masses, guarding or rebound.  Liver/spleen:  No organomegaly noted  Hernia:  None appreciated  Skin  Inspection:  Grossly normal mild erythematous superficial rash to shins bilaterally  Palpation:  Grossly normal Neurologic/psychiatric  Orientation:  Normal with appropriate conversation.  Mood/affect:  Normal  Genitourinary    Breasts: Examined lying and sitting.     Right: Without masses, retractions, discharge or axillary adenopathy.     Left: Without masses, retractions, discharge or axillary adenopathy.   Inguinal/mons:  Normal without inguinal adenopathy  External genitalia:  Normal  BUS/Urethra/Skene's glands:  Normal  Bladder:  Normal  Vagina:  Normal  Cervix:  Normal IUD strings visible  Uterus:   normal in size, shape and contour.  Midline and mobile  Adnexa/parametria:     Rt: Without masses or tenderness.   Lt: Without masses or tenderness.  Anus and  perineum: Normal  Digital rectal exam: Normal sphincter tone without palpated masses or tenderness  Assessment/Plan:  32 y.o. MBF G2P2 for annual exam with complaint of shin rash with itching and irritation.  Positive HR HPV 2012, 2013 without followup Mild erythematous rash shins bilateral Mirena IUD placed 02/2011  Plan: Mycolog small amount twice daily. If rash does not resolve followup with dermatologist. SBE's, regular exercise, calcium rich diet, MVI daily encouraged. CBC, glucose, UA, Pap with HR HPV typing. Reviewed importance of followup if persistent HR HPV typing.     Harrington Challenger WHNP, 12:01 PM 04/30/2013

## 2013-05-01 LAB — URINALYSIS W MICROSCOPIC + REFLEX CULTURE
Casts: NONE SEEN
Leukocytes, UA: NEGATIVE
Nitrite: NEGATIVE
Specific Gravity, Urine: 1.013 (ref 1.005–1.030)
Urobilinogen, UA: 1 mg/dL (ref 0.0–1.0)
pH: 6.5 (ref 5.0–8.0)

## 2013-05-01 LAB — GLUCOSE, RANDOM: Glucose, Bld: 65 mg/dL — ABNORMAL LOW (ref 70–99)

## 2013-09-20 ENCOUNTER — Ambulatory Visit (INDEPENDENT_AMBULATORY_CARE_PROVIDER_SITE_OTHER): Payer: BC Managed Care – PPO | Admitting: Internal Medicine

## 2013-09-20 VITALS — BP 96/64 | HR 69 | Temp 98.0°F | Resp 16 | Ht 64.0 in | Wt 133.0 lb

## 2013-09-20 DIAGNOSIS — R51 Headache: Secondary | ICD-10-CM

## 2013-09-20 MED ORDER — ONDANSETRON 4 MG PO TBDP
8.0000 mg | ORAL_TABLET | Freq: Once | ORAL | Status: AC
  Administered 2013-09-20: 8 mg via ORAL

## 2013-09-20 MED ORDER — RIZATRIPTAN BENZOATE 5 MG PO TBDP: 5.0000 mg | ORAL_TABLET | ORAL | Status: DC | PRN

## 2013-09-20 MED ORDER — KETOROLAC TROMETHAMINE 60 MG/2ML IM SOLN
60.0000 mg | Freq: Once | INTRAMUSCULAR | Status: AC
  Administered 2013-09-20: 60 mg via INTRAMUSCULAR

## 2013-09-20 MED ORDER — BUTALBITAL-APAP-CAFFEINE 50-325-40 MG PO TABS: 1.0000 | ORAL_TABLET | Freq: Four times a day (QID) | ORAL | Status: AC | PRN

## 2013-09-20 NOTE — Progress Notes (Signed)
Subjective:    Patient ID: Misty Russell, female    DOB: 03/26/1981, 33 y.o.   MRN: [id number]  HPI  Chief Complaint  Patient presents with  . Headache    occuring at night only   she has a four-day history of waking with a headache in the middle of the night She has photophobia but no dizziness. She has nausea but no vomiting. No change in vision These headaches have responded to ibuprofen or Excedrin so far Today for the first time she has a headache that is starting in the last 30 minutes As with the headaches that wake her she feels a fluttering feeling on the right side of her head followed by a sharp pain that is intens She has some sense of tightness in the back of her neck but no stiffness No fever chills or night sweats  She had the same type of headache 2 years ago and was evaluated here and given Esgic and Imitrex Esgic worked well but Imitrex caused her to feel terrible She has had no headaches after those resolved after a week or 2  She is a IT consultantparalegal Not very active physically Married with children/no particular stress source No illnesses or medications      Review of Systems Review of systems No fever chills night sweats or weight loss No fatigue No vision difficulties No sinus problems or allergy problems No chest pain or palpitations No shortness of breath or dyspnea on exertion No dyspepsia abdominal pain constipation or diarrhea No genitourinary symptoms No menstrual problems No joint or skin problems No depression or anxiety Substance use negative     Objective:   Physical Exam  BP 96/64  Pulse 69  Temp(Src) 98 F (36.7 C) (Oral)  Resp 16  Ht 5\' 4"  (1.626 m)  Wt 133 lb (60.328 kg)  BMI 22.82 kg/m2  SpO2 100% She describes pain in the right frontal area as 6/10 No acute distress Mild photophobia Pupils equal round reactive to light and accommodation Fundi benign with sharp discs Ears nose and throat are clear Neck is supple No  lymphadenopathy or thyromegaly Heart regular without murmur Lungs clear Extremities clear with full pulses Cranial nerves II through XII intact Cerebellar intact No sensory or motor losses Mood good affect appropriate thought content normal,judgment sound    Toradol 60 given in 20 minutes later her headache had resolved       Assessment & Plan:  Headache(784.0) - Plan: ketorolac (TORADOL) injection 60 mg, ondansetron (ZOFRAN-ODT) disintegrating tablet 8 mg  this suggests a type of migraine and we will try both Maxalt at onset and Esgic after established She is requesting a PCP for full exam and I have scheduled this  Meds ordered this encounter  Medications  . ketorolac (TORADOL) injection 60 mg    Sig:   . ondansetron (ZOFRAN-ODT) disintegrating tablet 8 mg    Sig:   . rizatriptan (MAXALT-MLT) 5 MG disintegrating tablet    Sig: Take 1 tablet (5 mg total) by mouth as needed for migraine. May repeat in 2 hours if needed    Dispense:  8 tablet    Refill:  0    No more than 2 in 24 hrs or 4 in one week  . butalbital-acetaminophen-caffeine (FIORICET) 50-325-40 MG per tablet    Sig: Take 1-2 tablets by mouth every 6 (six) hours as needed for headache.    Dispense:  20 tablet    Refill:  0

## 2013-09-29 NOTE — Progress Notes (Signed)
Patient states that her headache has gone away but will call back to schedule appointment if she has an issue.

## 2013-09-30 ENCOUNTER — Telehealth: Payer: Self-pay

## 2013-09-30 NOTE — Telephone Encounter (Signed)
Patient calling because she saw Dr. Merla Richesoolittle about a week ago and was told her was going to send her some zofran for nausea. She still has not received that and confirmed pharmacy; [facility] on [address].  Please calirify with patient, what she is supposed to have for medications from her last visit. Thank you!

## 2013-10-01 NOTE — Telephone Encounter (Signed)
Did you want to send her in some zofran?

## 2013-10-03 MED ORDER — ONDANSETRON HCL 4 MG PO TABS: 4.0000 mg | ORAL_TABLET | Freq: Three times a day (TID) | ORAL | Status: DC | PRN

## 2013-10-03 NOTE — Telephone Encounter (Signed)
Patient notified and voiced understanding. She wanted to let Dr. Merla Richesoolittle know update. Here HA's haven't been as frequent, but she does still have nausea with them. But overall improved.

## 2013-10-03 NOTE — Telephone Encounter (Signed)
zofran sent in. If not completely controlled over next week i,d like to see her again

## 2013-10-03 NOTE — Telephone Encounter (Signed)
Needs followup if not controlled w zofran

## 2014-02-08 ENCOUNTER — Telehealth: Payer: Self-pay | Admitting: *Deleted

## 2014-02-08 NOTE — Telephone Encounter (Signed)
Pt called c/o right side abdomen discomfort x 2 months, pt advised OV best, front desk will contact

## 2014-02-11 ENCOUNTER — Ambulatory Visit: Payer: BC Managed Care – PPO | Admitting: Gynecology

## 2014-06-13 ENCOUNTER — Ambulatory Visit (INDEPENDENT_AMBULATORY_CARE_PROVIDER_SITE_OTHER): Payer: BC Managed Care – PPO | Admitting: Women's Health

## 2014-06-13 ENCOUNTER — Other Ambulatory Visit (HOSPITAL_COMMUNITY)
Admission: RE | Admit: 2014-06-13 | Discharge: 2014-06-13 | Disposition: A | Discharge status: Home / Self Care | Payer: BC Managed Care – PPO | Source: Ambulatory Visit | Attending: Gynecology | Admitting: Gynecology

## 2014-06-13 ENCOUNTER — Encounter: Payer: Self-pay | Admitting: Women's Health

## 2014-06-13 VITALS — BP 110/75 | Ht 64.0 in | Wt 131.8 lb

## 2014-06-13 DIAGNOSIS — Z833 Family history of diabetes mellitus: Secondary | ICD-10-CM

## 2014-06-13 DIAGNOSIS — Z01419 Encounter for gynecological examination (general) (routine) without abnormal findings: Secondary | ICD-10-CM | POA: Insufficient documentation

## 2014-06-13 LAB — CBC WITH DIFFERENTIAL/PLATELET
Basophils Absolute: 0 10*3/uL (ref 0.0–0.1)
Basophils Relative: 0 % (ref 0–1)
EOS PCT: 2 % (ref 0–5)
Eosinophils Absolute: 0.1 10*3/uL (ref 0.0–0.7)
HCT: 41 % (ref 36.0–46.0)
Hemoglobin: 13.5 g/dL (ref 12.0–15.0)
LYMPHS ABS: 3 10*3/uL (ref 0.7–4.0)
LYMPHS PCT: 42 % (ref 12–46)
MCH: 29.7 pg (ref 26.0–34.0)
MCHC: 32.9 g/dL (ref 30.0–36.0)
MCV: 90.3 fL (ref 78.0–100.0)
MONO ABS: 0.4 10*3/uL (ref 0.1–1.0)
MPV: 10 fL (ref 9.4–12.4)
Monocytes Relative: 6 % (ref 3–12)
NEUTROS ABS: 3.6 10*3/uL (ref 1.7–7.7)
Neutrophils Relative %: 50 % (ref 43–77)
Platelets: 288 10*3/uL (ref 150–400)
RBC: 4.54 MIL/uL (ref 3.87–5.11)
RDW: 14.1 % (ref 11.5–15.5)
WBC: 7.1 10*3/uL (ref 4.0–10.5)

## 2014-06-13 LAB — GLUCOSE, RANDOM: GLUCOSE: 73 mg/dL (ref 70–99)

## 2014-06-13 NOTE — Progress Notes (Signed)
Misty FiremanCherish Mcphail 06/23/1981 [id number]    History:    Presents for annual exam.  Rare bleeding Mirena IUD placed 02/2011. History of positive HR HPV without follow-up, Pap 2014 normal with negative HR HPV. (Married, had a three-year breakup, back together.)  Past medical history, past surgical history, family history and social history were all reviewed and documented in the EPIC chart. Paralegal for criminal attorney. Daughter 739, son 8 both doing well. Mother diabetes.  ROS:  A ROS was performed and pertinent positives and negatives are included.  Exam:  There were no vitals filed for this visit.  General appearance:  Normal Thyroid:  Symmetrical, normal in size, without palpable masses or nodularity. Respiratory  Auscultation:  Clear without wheezing or rhonchi Cardiovascular  Auscultation:  Regular rate, without rubs, murmurs or gallops  Edema/varicosities:  Not grossly evident Abdominal  Soft,nontender, without masses, guarding or rebound.  Liver/spleen:  No organomegaly noted  Hernia:  None appreciated  Skin  Inspection:  Grossly normal   Breasts: Examined lying and sitting.     Right: Without masses, retractions, discharge or axillary adenopathy.     Left: Without masses, retractions, discharge or axillary adenopathy. Gentitourinary   Inguinal/mons:  Normal without inguinal adenopathy  External genitalia:  Normal  BUS/Urethra/Skene's glands:  Normal  Vagina:  Normal  Cervix:  Normal- IUD strings visible in os  Uterus:   normal in size, shape and contour.  Midline and mobile  Adnexa/parametria:     Rt: Without masses or tenderness.   Lt: Without masses or tenderness.  Anus and perineum: Normal  Digital rectal exam: Normal sphincter tone without palpated masses or tenderness  Assessment/Plan:  33 y.o. MBF G2 P2  for annual exam with no complaints.   9/12 Mirena IUD rare bleeding History of positive HR HPV  Plan: SBE's, regular exercise, calcium rich diet, MVI daily.  Contemplating third child. CBC, glucose, UA, Pap. (Pap normal with negative HR HPV 2014)    Harrington ChallengerYOUNG,NANCY J Bear Lake Memorial HospitalWHNP, 4:35 PM 06/13/2014

## 2014-06-13 NOTE — Patient Instructions (Signed)

## 2014-06-14 LAB — URINALYSIS W MICROSCOPIC + REFLEX CULTURE
Bilirubin Urine: NEGATIVE
CASTS: NONE SEEN
Crystals: NONE SEEN
Glucose, UA: NEGATIVE mg/dL
Hgb urine dipstick: NEGATIVE
Ketones, ur: NEGATIVE mg/dL
NITRITE: NEGATIVE
Protein, ur: NEGATIVE mg/dL
SPECIFIC GRAVITY, URINE: 1.011 (ref 1.005–1.030)
Squamous Epithelial / LPF: NONE SEEN
Urobilinogen, UA: 0.2 mg/dL (ref 0.0–1.0)
pH: 7 (ref 5.0–8.0)

## 2014-06-15 LAB — URINE CULTURE

## 2014-06-15 LAB — CYTOLOGY - PAP

## 2014-07-25 ENCOUNTER — Other Ambulatory Visit: Payer: Self-pay | Admitting: Women's Health

## 2014-07-25 DIAGNOSIS — N390 Urinary tract infection, site not specified: Secondary | ICD-10-CM

## 2014-07-25 MED ORDER — SULFAMETHOXAZOLE-TRIMETHOPRIM 800-160 MG PO TABS: 1.0000 | ORAL_TABLET | Freq: Two times a day (BID) | ORAL | Status: DC

## 2014-08-18 ENCOUNTER — Ambulatory Visit: Payer: BLUE CROSS/BLUE SHIELD

## 2014-08-19 ENCOUNTER — Ambulatory Visit (INDEPENDENT_AMBULATORY_CARE_PROVIDER_SITE_OTHER): Payer: BLUE CROSS/BLUE SHIELD | Admitting: Family Medicine

## 2014-08-19 VITALS — BP 100/62 | HR 67 | Temp 98.6°F | Resp 18 | Ht 65.0 in | Wt 133.0 lb

## 2014-08-19 DIAGNOSIS — G43009 Migraine without aura, not intractable, without status migrainosus: Secondary | ICD-10-CM

## 2014-08-19 MED ORDER — SUMATRIPTAN SUCCINATE 100 MG PO TABS: 100.0000 mg | ORAL_TABLET | Freq: Once | ORAL | Status: DC

## 2014-08-19 MED ORDER — ONDANSETRON HCL 4 MG PO TABS: 4.0000 mg | ORAL_TABLET | Freq: Three times a day (TID) | ORAL | Status: DC | PRN

## 2014-08-19 NOTE — Patient Instructions (Signed)
Hopefully your migraine headaches will go away as quickly as they came.  If not there are other medications we can use for prevention It is ok to take a fioricet at bedtime for a couple of days if you need to You can also try an imitrex tablet at onset of headache- these work best if taken at the first sign of migraine! Use the zofran as needed for nausea.    If your headaches persist please call me or come back in.  If you have an unusually bad headache, "worst headache of your life" that does not go away you should go to the ER
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
## 2014-08-30 ENCOUNTER — Telehealth: Payer: Self-pay

## 2014-08-30 DIAGNOSIS — G43101 Migraine with aura, not intractable, with status migrainosus: Secondary | ICD-10-CM

## 2014-08-30 MED ORDER — SUMATRIPTAN SUCCINATE 4 MG/0.5ML ~~LOC~~ SOAJ: SUBCUTANEOUS | Status: DC

## 2014-08-30 NOTE — Telephone Encounter (Signed)
Migraine without aura and without status migrainosus, not intractable - Plan: ondansetron (ZOFRAN) 4 MG tablet, SUMAtriptan (IMITREX) 100 MG tablet  Here today with recurrent migraine a couple of times over the past week. Otherwise she has been without migraine for about one year. Will have her take a fiorcet at night for a couple of days and then stop- will see if her HA persist. If so can try imitrex as needed, topamax if sx do not remit   Signed Abbe AmsterdamJessica Copland, MD  Dr Patsy Lageropland can we send in the injections? Please advise.

## 2014-08-30 NOTE — Telephone Encounter (Signed)
Called her- her migraines have been a lot worse lately. She has used imitrex injections in the past without problems.  She seems to be waking up with HA a lot recently.   I will rx the imitrex injection for her and will refer her to a neurologist.  She has been having more trouble with HA for 2-3 weeks now.  When she takes medication the HA does go away but she would like to see if anything else can be done.

## 2014-08-30 NOTE — Telephone Encounter (Signed)
Pt states that the SUMAtriptan (IMITREX) 100 MG tablet [[id number]][[id number]]  Is not working for her and would like to have the injections instead. Please advise

## 2014-08-31 ENCOUNTER — Telehealth: Payer: Self-pay

## 2014-08-31 DIAGNOSIS — G43709 Chronic migraine without aura, not intractable, without status migrainosus: Secondary | ICD-10-CM

## 2014-08-31 MED ORDER — SUMATRIPTAN SUCCINATE 6 MG/0.5ML ~~LOC~~ SOAJ: SUBCUTANEOUS | Status: DC

## 2014-08-31 NOTE — Telephone Encounter (Signed)
Patient has lots of concerns regarding her Imitrex medication.   [phone]

## 2014-08-31 NOTE — Telephone Encounter (Signed)
[facility] 4 mg/0.265ml is not available, but they do have the 6 mg/0.5 ml in stock. They are requesting a new Rx for the 6 mg strength if approved. Called pt to see if this is her concern about the medication and she stated that "yes" that is the only question she has. She also wanted Dr Patsy Lageropland to know that the neurologist is going to see her first thing tomorrow morn. Please advise. Pended but wasn't sure what directions/quantity you would want.

## 2014-09-01 ENCOUNTER — Encounter: Payer: Self-pay | Admitting: Neurology

## 2014-09-01 ENCOUNTER — Ambulatory Visit (INDEPENDENT_AMBULATORY_CARE_PROVIDER_SITE_OTHER): Payer: BLUE CROSS/BLUE SHIELD | Admitting: Neurology

## 2014-09-01 VITALS — BP 126/70 | HR 66 | Temp 98.6°F | Resp 16 | Ht 65.0 in | Wt 133.1 lb

## 2014-09-01 DIAGNOSIS — G43109 Migraine with aura, not intractable, without status migrainosus: Secondary | ICD-10-CM

## 2014-09-01 DIAGNOSIS — G4452 New daily persistent headache (NDPH): Secondary | ICD-10-CM

## 2014-09-01 DIAGNOSIS — Z566 Other physical and mental strain related to work: Secondary | ICD-10-CM

## 2014-09-01 MED ORDER — TOPIRAMATE 25 MG PO TABS: 25.0000 mg | ORAL_TABLET | Freq: Two times a day (BID) | ORAL | Status: DC

## 2014-09-01 NOTE — Patient Instructions (Addendum)
1.  Start topiramate 25mg  at bedtime.  Possible side effects include: impaired thinking, sedation, paresthesias (numbness and tingling) and weight loss.  It may cause dehydration and there is a small risk for kidney stones, so make sure to stay hydrated with water during the day.  There is also a very small risk for glaucoma, so if you notice any change in your vision while taking this medication, see an ophthalmologist.  2.  Try the Imitrex 6mg  injection.  Take at earliest onset of headache or if you wake up with headache.  May repeat once in 1 hour if needed.  Do not exceed two injections in 24 hours 3.  Try to limit pain relievers to no more than 2 days out of the week 4.  Will get [facility]14 at 7:45 am  5.  Will refer for cognitive behavioral therapy to help deal with stress 6.  Follow up in 6 weeks.  Call with update in 4 weeks.
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
## 2014-09-19 ENCOUNTER — Ambulatory Visit (HOSPITAL_COMMUNITY): Admission: RE | Admit: 2014-09-19 | Payer: BLUE CROSS/BLUE SHIELD | Source: Ambulatory Visit

## 2014-10-14 ENCOUNTER — Ambulatory Visit: Payer: BLUE CROSS/BLUE SHIELD | Admitting: Neurology

## 2014-12-05 ENCOUNTER — Telehealth: Payer: Self-pay | Admitting: Neurology

## 2014-12-05 NOTE — Telephone Encounter (Signed)
She did not have this done and is doing better so does not want to have it done  Per patient

## 2014-12-05 NOTE — Telephone Encounter (Signed)
Pt ordered imaging in March 2016. Please follow up on this. It is still in the referral workqueue / Sherri S.

## 2015-05-13 ENCOUNTER — Ambulatory Visit (INDEPENDENT_AMBULATORY_CARE_PROVIDER_SITE_OTHER): Payer: BLUE CROSS/BLUE SHIELD | Admitting: Internal Medicine

## 2015-05-13 VITALS — BP 110/70 | HR 76 | Temp 98.5°F | Resp 18 | Ht 65.0 in | Wt 135.4 lb

## 2015-05-13 DIAGNOSIS — R350 Frequency of micturition: Secondary | ICD-10-CM | POA: Diagnosis not present

## 2015-05-13 LAB — POCT URINALYSIS DIP (MANUAL ENTRY)
BILIRUBIN UA: NEGATIVE
BILIRUBIN UA: NEGATIVE
Glucose, UA: NEGATIVE
LEUKOCYTES UA: NEGATIVE
NITRITE UA: NEGATIVE
PH UA: 7.5
Protein Ur, POC: NEGATIVE
RBC UA: NEGATIVE
Spec Grav, UA: 1.015
Urobilinogen, UA: 1

## 2015-05-13 LAB — POC MICROSCOPIC URINALYSIS (UMFC)

## 2015-05-13 NOTE — Progress Notes (Signed)
Subjective:    Patient ID: Misty Russell, female    DOB: 11/16/1980, [age] y.o.   MRN: [id number] This chart was scribed for Ellamae Siaobert Ferdie Bakken, MD by Jolene Provostobert Halas, Medical Scribe. This patient was seen in Room 5 and the patient's care was started a 3:12 PM.  Chief Complaint  Patient presents with  . Diabetes    Wants to be checked for diabetes. Pt. stated that her urine smells "sweet".     HPI HPI Comments: Misty FiremanCherish Nayak is a 34 y.o. female who [facility] possible diabetes. She states for the last six months her pea has had a sweet smell and she has been urinating more frequently. She has a family hx of DM. She denies abnormal nocturia. She states she drinks a large amount of soda on a daily basis.   Review of Systems  Constitutional: Negative for fever and chills.  Genitourinary: Positive for frequency. Negative for dysuria and urgency.  Neurological: Negative for dizziness, weakness and light-headedness.       Objective:  BP 110/70 mmHg  Pulse 76  Temp(Src) 98.5 F (36.9 C) (Oral)  Resp 18  Ht 5\' 5"  (1.651 m)  Wt 135 lb 6.4 oz (61.417 kg)  BMI 22.53 kg/m2  SpO2 98%  Physical Exam  Constitutional: She is oriented to person, place, and time. She appears well-developed and well-nourished. No distress.  HENT:  Head: Normocephalic and atraumatic.  Eyes: Pupils are equal, round, and reactive to light.  Neck: Neck supple.  Cardiovascular: Normal rate.   Pulmonary/Chest: Effort normal. No respiratory distress.  Musculoskeletal: Normal range of motion.  Neurological: She is alert and oriented to person, place, and time. Coordination normal.  Skin: Skin is warm and dry. She is not diaphoretic.  Psychiatric: She has a normal mood and affect. Her behavior is normal.  Nursing note and vitals reviewed.     Wt Readings from Last 3 Encounters:  05/13/15 135 lb 6.4 oz (61.417 kg)  09/01/14 133 lb 1.6 oz (60.374 kg)  08/19/14 133 lb (60.328 kg)    Results  for orders placed or performed in visit on 05/13/15  POCT urinalysis dipstick  Result Value Ref Range   Color, UA yellow yellow   Clarity, UA clear clear   Glucose, UA negative negative   Bilirubin, UA negative negative   Ketones, POC UA negative negative   Spec Grav, UA 1.015    Blood, UA negative negative   pH, UA 7.5    Protein Ur, POC negative negative   Urobilinogen, UA 1.0    Nitrite, UA Negative Negative   Leukocytes, UA Negative Negative  POCT Microscopic Urinalysis ([facility])  Result Value Ref Range   WBC,UR,HPF,POC None None WBC/hpf   RBC,UR,HPF,POC None None RBC/hpf   Bacteria Moderate (A) None, Too numerous to count   Mucus Present (A) Absent   Epithelial Cells, UR Per Microscopy Few (A) None, Too numerous to count cells/hpf    Assessment & Plan:  Urinary frequency - with a negative urine screen for diabetes Patient was reassured/change soda for water Follow-up 3-6 months to retest given family history    I have completed the patient encounter in its entirety as documented by the scribe, with editing by me where necessary. Babbie Dondlinger P. Merla Richesoolittle, M.D.   By signing my name below, I, Javier Dockerobert Ryan Halas, attest that this documentation has been prepared under the direction and in the presence of Ellamae Siaobert Cobe Viney, MD. Electronically Signed: Javier Dockerobert Ryan Halas, ER Scribe.  05/13/2015. 3:12 PM.

## 2015-06-16 ENCOUNTER — Ambulatory Visit (INDEPENDENT_AMBULATORY_CARE_PROVIDER_SITE_OTHER): Payer: BLUE CROSS/BLUE SHIELD | Admitting: Gynecology

## 2015-06-16 ENCOUNTER — Encounter: Payer: Self-pay | Admitting: Gynecology

## 2015-06-16 VITALS — BP 120/86 | Ht 64.0 in | Wt 134.0 lb

## 2015-06-16 DIAGNOSIS — Z01419 Encounter for gynecological examination (general) (routine) without abnormal findings: Secondary | ICD-10-CM

## 2015-06-16 LAB — URINALYSIS W MICROSCOPIC + REFLEX CULTURE
BILIRUBIN URINE: NEGATIVE
CRYSTALS: NONE SEEN [HPF]
Casts: NONE SEEN [LPF]
GLUCOSE, UA: NEGATIVE
Hgb urine dipstick: NEGATIVE
Ketones, ur: NEGATIVE
LEUKOCYTES UA: NEGATIVE
NITRITE: POSITIVE — AB
PH: 7 (ref 5.0–8.0)
Protein, ur: NEGATIVE
SPECIFIC GRAVITY, URINE: 1.023 (ref 1.001–1.035)
Yeast: NONE SEEN [HPF]

## 2015-06-16 LAB — COMPREHENSIVE METABOLIC PANEL
ALK PHOS: 60 U/L (ref 33–115)
ALT: 13 U/L (ref 6–29)
AST: 17 U/L (ref 10–30)
Albumin: 4.2 g/dL (ref 3.6–5.1)
BILIRUBIN TOTAL: 0.9 mg/dL (ref 0.2–1.2)
BUN: 11 mg/dL (ref 7–25)
CO2: 26 mmol/L (ref 20–31)
CREATININE: 0.83 mg/dL (ref 0.50–1.10)
Calcium: 9.4 mg/dL (ref 8.6–10.2)
Chloride: 104 mmol/L (ref 98–110)
GLUCOSE: 75 mg/dL (ref 65–99)
Potassium: 4.7 mmol/L (ref 3.5–5.3)
Sodium: 139 mmol/L (ref 135–146)
TOTAL PROTEIN: 7.3 g/dL (ref 6.1–8.1)

## 2015-06-16 LAB — LIPID PANEL
CHOLESTEROL: 157 mg/dL (ref 125–200)
HDL: 49 mg/dL (ref >=46)
LDL Cholesterol: 100 mg/dL (ref <130)
Total CHOL/HDL Ratio: 3.2 Ratio (ref <=5.0)
Triglycerides: 41 mg/dL (ref <150)
VLDL: 8 mg/dL (ref <30)

## 2015-06-16 LAB — TSH: TSH: 1.143 u[IU]/mL (ref 0.350–4.500)

## 2015-06-16 NOTE — Progress Notes (Signed)
Misty FiremanCherish Russell 06/02/1981 [id number]   History:    34 y.o.  for annual gyn exam with no complaints today who is contemplating getting pregnant next year. Patient had a Mirena IUD placed in 2012. Patient with past history of Pap smear with HR HPV without follow-up, Pap 2014 normal with negative HR HPV. (Married, had a three-year breakup, back together.). Pap smear 2015 was normal. Patient reports normal menstrual cycles.  Past medical history,surgical history, family history and social history were all reviewed and documented in the EPIC chart.  Gynecologic History No LMP recorded. Patient is not currently having periods (Reason: IUD). Contraception: IUD Last Pap: 2015. Results were: normal Last mammogram: Not indicated. Results were: Not indicated  Obstetric History OB History  Gravida Para Term Preterm AB SAB TAB Ectopic Multiple Living  2 2        2     # Outcome Date GA Lbr Len/2nd Weight Sex Delivery Anes PTL Lv  2 Para           1 Para                ROS: A ROS was performed and pertinent positives and negatives are included in the history.  GENERAL: No fevers or chills. HEENT: No change in vision, no earache, sore throat or sinus congestion. NECK: No pain or stiffness. CARDIOVASCULAR: No chest pain or pressure. No palpitations. PULMONARY: No shortness of breath, cough or wheeze. GASTROINTESTINAL: No abdominal pain, nausea, vomiting or diarrhea, melena or bright red blood per rectum. GENITOURINARY: No urinary frequency, urgency, hesitancy or dysuria. MUSCULOSKELETAL: No joint or muscle pain, no back pain, no recent trauma. DERMATOLOGIC: No rash, no itching, no lesions. ENDOCRINE: No polyuria, polydipsia, no heat or cold intolerance. No recent change in weight. HEMATOLOGICAL: No anemia or easy bruising or bleeding. NEUROLOGIC: No headache, seizures, numbness, tingling or weakness. PSYCHIATRIC: No depression, no loss of interest in normal activity or change in sleep pattern.      Exam: chaperone present  BP 120/86 mmHg  Ht 5\' 4"  (1.626 m)  Wt 134 lb (60.782 kg)  BMI 22.99 kg/m2  Body mass index is 22.99 kg/(m^2).  General appearance : Well developed well nourished female. No acute distress HEENT: Eyes: no retinal hemorrhage or exudates,  Neck supple, trachea midline, no carotid bruits, no thyroidmegaly Lungs: Clear to auscultation, no rhonchi or wheezes, or rib retractions  Heart: Regular rate and rhythm, no murmurs or gallops Breast:Examined in sitting and supine position were symmetrical in appearance, no palpable masses or tenderness,  no skin retraction, no nipple inversion, no nipple discharge, no skin discoloration, no axillary or supraclavicular lymphadenopathy Abdomen: no palpable masses or tenderness, no rebound or guarding Extremities: no edema or skin discoloration or tenderness  Pelvic:  Bartholin, Urethra, Skene Glands: Within normal limits             Vagina: No gross lesions or discharge  Cervix: No gross lesions or discharge, IUD string seen  Uterus  anteverted, normal size, shape and consistency, non-tender and mobile  Adnexa  Without masses or tenderness  Anus and perineum  normal   Rectovaginal  normal sphincter tone without palpated masses or tenderness             Hemoccult not indicated     Assessment/Plan:  34 y.o. female for annual exam will return back next year month before attempting pregnancy to remove her IUD. I've recommended she begin taking prenatal vitamins daily. The following screening blood work  was ordered today: Comprehensive metabolic panel, fasting lipid profile, TSH, CBC, and urinalysis. Patient received flu vaccine today. Pap smear not done today.   Ok Edwards MD, 9:53 AM 06/16/2015

## 2015-06-17 LAB — CBC WITH DIFFERENTIAL/PLATELET
BASOS ABS: 0.1 10*3/uL (ref 0.0–0.1)
Basophils Relative: 1 % (ref 0–1)
EOS ABS: 0.1 10*3/uL (ref 0.0–0.7)
EOS PCT: 2 % (ref 0–5)
HCT: 40.3 % (ref 36.0–46.0)
Hemoglobin: 13.1 g/dL (ref 12.0–15.0)
LYMPHS PCT: 40 % (ref 12–46)
Lymphs Abs: 2 10*3/uL (ref 0.7–4.0)
MCH: 29.8 pg (ref 26.0–34.0)
MCHC: 32.5 g/dL (ref 30.0–36.0)
MCV: 91.8 fL (ref 78.0–100.0)
MPV: 9.9 fL (ref 8.6–12.4)
Monocytes Absolute: 0.3 10*3/uL (ref 0.1–1.0)
Monocytes Relative: 6 % (ref 3–12)
NEUTROS PCT: 51 % (ref 43–77)
Neutro Abs: 2.6 10*3/uL (ref 1.7–7.7)
PLATELETS: 254 10*3/uL (ref 150–400)
RBC: 4.39 MIL/uL (ref 3.87–5.11)
RDW: 13.7 % (ref 11.5–15.5)
WBC: 5 10*3/uL (ref 4.0–10.5)

## 2015-06-18 LAB — URINE CULTURE

## 2015-06-20 ENCOUNTER — Other Ambulatory Visit: Payer: Self-pay | Admitting: Gynecology

## 2015-06-20 MED ORDER — CIPROFLOXACIN HCL 250 MG PO TABS: 250.0000 mg | ORAL_TABLET | Freq: Two times a day (BID) | ORAL | Status: DC

## 2015-06-20 MED ORDER — NITROFURANTOIN MONOHYD MACRO 100 MG PO CAPS: 100.0000 mg | ORAL_CAPSULE | Freq: Two times a day (BID) | ORAL | Status: DC

## 2015-10-05 ENCOUNTER — Encounter: Payer: Self-pay | Admitting: Gynecology

## 2015-10-05 ENCOUNTER — Ambulatory Visit (INDEPENDENT_AMBULATORY_CARE_PROVIDER_SITE_OTHER): Payer: BLUE CROSS/BLUE SHIELD | Admitting: Gynecology

## 2015-10-05 ENCOUNTER — Telehealth: Payer: Self-pay | Admitting: *Deleted

## 2015-10-05 VITALS — BP 112/70

## 2015-10-05 DIAGNOSIS — F4322 Adjustment disorder with anxiety: Secondary | ICD-10-CM | POA: Diagnosis not present

## 2015-10-05 DIAGNOSIS — I739 Peripheral vascular disease, unspecified: Secondary | ICD-10-CM | POA: Diagnosis not present

## 2015-10-05 DIAGNOSIS — R350 Frequency of micturition: Secondary | ICD-10-CM | POA: Diagnosis not present

## 2015-10-05 LAB — URINALYSIS W MICROSCOPIC + REFLEX CULTURE
BILIRUBIN URINE: NEGATIVE
CASTS: NONE SEEN [LPF]
CRYSTALS: NONE SEEN [HPF]
GLUCOSE, UA: NEGATIVE
HGB URINE DIPSTICK: NEGATIVE
Ketones, ur: NEGATIVE
Leukocytes, UA: NEGATIVE
Nitrite: NEGATIVE
PROTEIN: NEGATIVE
Specific Gravity, Urine: 1.025 (ref 1.001–1.035)
WBC UA: NONE SEEN WBC/HPF (ref <=5)
Yeast: NONE SEEN [HPF]
pH: 6 (ref 5.0–8.0)

## 2015-10-05 MED ORDER — ALPRAZOLAM 0.25 MG PO TABS: 0.2500 mg | ORAL_TABLET | Freq: Every evening | ORAL | Status: DC | PRN

## 2015-10-05 NOTE — Telephone Encounter (Signed)
-----   Message from Ok EdwardsJuan H Fernandez, MD sent at 10/05/2015  9:03 AM EDT ----- Victorino DikeJennifer please make an appointment for this patient with the vascular surgeon group who has  peripheral vascular insufficencyperiheral of lower extremities

## 2015-10-05 NOTE — Addendum Note (Signed)
Addended by: Berna SpareASTILLO, BLANCA A on: 10/05/2015 10:19 AM   Modules accepted: Orders
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
## 2015-10-05 NOTE — Telephone Encounter (Signed)
Notes faxed to [facility] they will contact pt to schedule and let me know the time and date as well.

## 2015-10-07 LAB — URINE CULTURE
COLONY COUNT: NO GROWTH
Organism ID, Bacteria: NO GROWTH

## 2015-10-10 NOTE — Telephone Encounter (Signed)
[facility]

## 2015-10-13 ENCOUNTER — Other Ambulatory Visit: Payer: Self-pay | Admitting: *Deleted

## 2015-10-13 DIAGNOSIS — L819 Disorder of pigmentation, unspecified: Secondary | ICD-10-CM

## 2016-01-25 ENCOUNTER — Encounter: Payer: Self-pay | Admitting: Vascular Surgery

## 2016-01-30 ENCOUNTER — Ambulatory Visit (HOSPITAL_COMMUNITY): Payer: BLUE CROSS/BLUE SHIELD

## 2016-01-30 ENCOUNTER — Encounter: Payer: BLUE CROSS/BLUE SHIELD | Admitting: Vascular Surgery

## 2016-07-18 ENCOUNTER — Encounter: Payer: Self-pay | Admitting: Gynecology

## 2016-07-18 ENCOUNTER — Ambulatory Visit (INDEPENDENT_AMBULATORY_CARE_PROVIDER_SITE_OTHER): Payer: 59 | Admitting: Gynecology

## 2016-07-18 VITALS — BP 118/80 | Ht 64.0 in | Wt 141.0 lb

## 2016-07-18 DIAGNOSIS — Z01411 Encounter for gynecological examination (general) (routine) with abnormal findings: Secondary | ICD-10-CM

## 2016-07-18 DIAGNOSIS — N912 Amenorrhea, unspecified: Secondary | ICD-10-CM

## 2016-07-18 LAB — PREGNANCY, URINE: Preg Test, Ur: NEGATIVE

## 2016-07-18 NOTE — Addendum Note (Signed)
Addended by: Kem ParkinsonBARNES, Libby Goehring on: 07/18/2016 02:46 PM   Modules accepted: Orders

## 2016-07-18 NOTE — Progress Notes (Signed)
Misty Russell 1981/01/11 [id number]   History:    36 y.o.  for annual gyn exam with no complaints today. Review of patient's record indicated she had the Mirena IUD placed in 2012. Patient will return back next week in a fasting state for fasting blood work and we will change her Mirena IUD and replacement with a new one.Patient with past history of Pap smear with HR HPV without follow-up, Pap 2014 normal with negative HR HPV. (Married, had a three-year breakup, back together.). Pap smear 2015 was normal. Patient has not had a menstrual cycle in several months. Urine pregnancy test in the office was negative today.  Past medical history,surgical history, family history and social history were all reviewed and documented in the EPIC chart.  Gynecologic History No LMP recorded. Patient is not currently having periods (Reason: IUD). Contraception: IUD Last Pap: 2015. Results were: normal Last mammogram: Not indicated. Results were: normal  Obstetric History OB History  Gravida Para Term Preterm AB Living  2 2       2   SAB TAB Ectopic Multiple Live Births               # Outcome Date GA Lbr Len/2nd Weight Sex Delivery Anes PTL Lv  2 Para           1 Para                ROS: A ROS was performed and pertinent positives and negatives are included in the history.  GENERAL: No fevers or chills. HEENT: No change in vision, no earache, sore throat or sinus congestion. NECK: No pain or stiffness. CARDIOVASCULAR: No chest pain or pressure. No palpitations. PULMONARY: No shortness of breath, cough or wheeze. GASTROINTESTINAL: No abdominal pain, nausea, vomiting or diarrhea, melena or bright red blood per rectum. GENITOURINARY: No urinary frequency, urgency, hesitancy or dysuria. MUSCULOSKELETAL: No joint or muscle pain, no back pain, no recent trauma. DERMATOLOGIC: No rash, no itching, no lesions. ENDOCRINE: No polyuria, polydipsia, no heat or cold intolerance. No recent change in weight.  HEMATOLOGICAL: No anemia or easy bruising or bleeding. NEUROLOGIC: No headache, seizures, numbness, tingling or weakness. PSYCHIATRIC: No depression, no loss of interest in normal activity or change in sleep pattern.     Exam: chaperone present  BP 118/80   Ht 5\' 4"  (1.626 m)   Wt 141 lb (64 kg)   BMI 24.20 kg/m   Body mass index is 24.2 kg/m.  General appearance : Well developed well nourished female. No acute distress HEENT: Eyes: no retinal hemorrhage or exudates,  Neck supple, trachea midline, no carotid bruits, no thyroidmegaly Lungs: Clear to auscultation, no rhonchi or wheezes, or rib retractions  Heart: Regular rate and rhythm, no murmurs or gallops Breast:Examined in sitting and supine position were symmetrical in appearance, no palpable masses or tenderness,  no skin retraction, no nipple inversion, no nipple discharge, no skin discoloration, no axillary or supraclavicular lymphadenopathy Abdomen: no palpable masses or tenderness, no rebound or guarding Extremities: no edema or skin discoloration or tenderness  Pelvic:  Bartholin, Urethra, Skene Glands: Within normal limits             Vagina: No gross lesions or discharge  Cervix: No gross lesions or discharge  Uterus  anteverted, normal size, shape and consistency, non-tender and mobile  Adnexa  Without masses or tenderness  Anus and perineum  normal   Rectovaginal  normal sphincter tone without palpated masses or tenderness  Hemoccult not indicated     Assessment/Plan:  36 y.o. female for annual exam will return next week in a fasting state for the following screening fasting blood work: Fasting lipid profile, comprehensive metabolic panel, TSH, CBC, and urinalysis. Pap smear was done today. When she comes back next week we will change her Mirena IUD.   Ok EdwardsFERNANDEZ,Bryar Rennie H MD, 2:31 PM 07/18/2016

## 2016-07-19 LAB — URINALYSIS W MICROSCOPIC + REFLEX CULTURE
Bacteria, UA: NONE SEEN [HPF]
Bilirubin Urine: NEGATIVE
CASTS: NONE SEEN [LPF]
CRYSTALS: NONE SEEN [HPF]
Glucose, UA: NEGATIVE
Hgb urine dipstick: NEGATIVE
KETONES UR: NEGATIVE
Leukocytes, UA: NEGATIVE
Nitrite: NEGATIVE
Protein, ur: NEGATIVE
RBC / HPF: NONE SEEN RBC/HPF (ref <=2)
SQUAMOUS EPITHELIAL / LPF: NONE SEEN [HPF] (ref <=5)
Specific Gravity, Urine: 1.013 (ref 1.001–1.035)
Yeast: NONE SEEN [HPF]
pH: 6.5 (ref 5.0–8.0)

## 2016-07-21 LAB — URINE CULTURE

## 2016-07-22 LAB — PAP IG W/ RFLX HPV ASCU

## 2016-08-02 ENCOUNTER — Ambulatory Visit: Payer: 59 | Admitting: Gynecology

## 2016-11-06 ENCOUNTER — Encounter: Payer: Self-pay | Admitting: Gynecology

## 2016-11-28 ENCOUNTER — Encounter (HOSPITAL_COMMUNITY): Payer: Self-pay

## 2016-11-28 ENCOUNTER — Emergency Department (HOSPITAL_COMMUNITY)
Admission: EM | Admit: 2016-11-28 | Discharge: 2016-11-28 | Disposition: A | Discharge status: Home / Self Care | Payer: BC Managed Care – PPO | Attending: Emergency Medicine | Admitting: Emergency Medicine

## 2016-11-28 DIAGNOSIS — Y9301 Activity, walking, marching and hiking: Secondary | ICD-10-CM | POA: Insufficient documentation

## 2016-11-28 DIAGNOSIS — W540XXA Bitten by dog, initial encounter: Secondary | ICD-10-CM | POA: Insufficient documentation

## 2016-11-28 DIAGNOSIS — Y929 Unspecified place or not applicable: Secondary | ICD-10-CM | POA: Insufficient documentation

## 2016-11-28 DIAGNOSIS — Y999 Unspecified external cause status: Secondary | ICD-10-CM | POA: Diagnosis not present

## 2016-11-28 DIAGNOSIS — Z79899 Other long term (current) drug therapy: Secondary | ICD-10-CM | POA: Insufficient documentation

## 2016-11-28 DIAGNOSIS — S81812A Laceration without foreign body, left lower leg, initial encounter: Secondary | ICD-10-CM | POA: Diagnosis not present

## 2016-11-28 DIAGNOSIS — Z23 Encounter for immunization: Secondary | ICD-10-CM | POA: Diagnosis not present

## 2016-11-28 DIAGNOSIS — S81852A Open bite, left lower leg, initial encounter: Secondary | ICD-10-CM | POA: Diagnosis present

## 2016-11-28 MED ORDER — IBUPROFEN 600 MG PO TABS: 600.0000 mg | ORAL_TABLET | Freq: Four times a day (QID) | ORAL | 0 refills | Status: DC | PRN

## 2016-11-28 MED ORDER — IBUPROFEN 800 MG PO TABS
800.0000 mg | ORAL_TABLET | Freq: Once | ORAL | Status: AC
Start: 2016-11-28 — End: 2016-11-28
  Administered 2016-11-28: 800 mg via ORAL
  Filled 2016-11-28: qty 1

## 2016-11-28 MED ORDER — AMOXICILLIN-POT CLAVULANATE 875-125 MG PO TABS
1.0000 | ORAL_TABLET | Freq: Once | ORAL | Status: AC
  Administered 2016-11-28: 1 via ORAL
  Filled 2016-11-28: qty 1

## 2016-11-28 MED ORDER — LIDOCAINE-EPINEPHRINE (PF) 2 %-1:200000 IJ SOLN
10.0000 mL | Freq: Once | INTRAMUSCULAR | Status: AC
  Administered 2016-11-28: 10 mL
  Filled 2016-11-28: qty 20

## 2016-11-28 MED ORDER — AMOXICILLIN-POT CLAVULANATE 875-125 MG PO TABS: 1.0000 | ORAL_TABLET | Freq: Two times a day (BID) | ORAL | 0 refills | Status: DC

## 2016-11-28 MED ORDER — TETANUS-DIPHTH-ACELL PERTUSSIS 5-2.5-18.5 LF-MCG/0.5 IM SUSP
0.5000 mL | Freq: Once | INTRAMUSCULAR | Status: AC
  Administered 2016-11-28: 0.5 mL via INTRAMUSCULAR
  Filled 2016-11-28: qty 0.5

## 2016-11-28 MED ORDER — RABIES VACCINE, PCEC IM SUSR
1.0000 mL | Freq: Once | INTRAMUSCULAR | Status: AC
  Administered 2016-11-28: 1 mL via INTRAMUSCULAR
  Filled 2016-11-28: qty 1

## 2016-11-28 MED ORDER — OXYCODONE-ACETAMINOPHEN 5-325 MG PO TABS: 1.0000 | ORAL_TABLET | Freq: Four times a day (QID) | ORAL | 0 refills | Status: DC | PRN

## 2016-11-28 MED ORDER — RABIES IMMUNE GLOBULIN 150 UNIT/ML IM INJ
1275.0000 [IU] | INJECTION | Freq: Once | INTRAMUSCULAR | Status: AC
  Administered 2016-11-28: 1275 [IU]
  Filled 2016-11-28: qty 8.5

## 2016-11-28 NOTE — Discharge Instructions (Signed)
We advise that you take ibuprofen for pain and ice areas of injury to limit swelling. You may use crutches for comfort and stability while walking if desired.  For your animal bites, we strongly advised that you take Augmentin as prescribed to prevent infection. Take the full course of these antibiotics. You have also been covered for rabies. You must return to your primary care doctor or the emergency department for repeat rabies vaccinations. These additional vaccinations need to be completed on 12/01/16, 12/05/16, and 12/12/16.   Return sooner if symptoms worsen or signs of infection develop. Have your stitches removed at your visit on 12/12/16. Apply bacitracin over top of your wound to limit scarring. We also advise that you change your dressing at least once per day to keep the area clean and dry.

## 2016-11-28 NOTE — ED Triage Notes (Signed)
Pt was bit by an unknown dog this evening Pt has an opened wound on her left lower leg and several abrasion marks
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
## 2016-12-07 ENCOUNTER — Ambulatory Visit (INDEPENDENT_AMBULATORY_CARE_PROVIDER_SITE_OTHER): Payer: BC Managed Care – PPO | Admitting: Urgent Care

## 2016-12-07 ENCOUNTER — Encounter: Payer: Self-pay | Admitting: Urgent Care

## 2016-12-07 VITALS — BP 104/66 | HR 67 | Temp 99.0°F | Resp 16 | Ht 64.0 in | Wt 141.0 lb

## 2016-12-07 DIAGNOSIS — M79662 Pain in left lower leg: Secondary | ICD-10-CM

## 2016-12-07 DIAGNOSIS — L03116 Cellulitis of left lower limb: Secondary | ICD-10-CM | POA: Diagnosis not present

## 2016-12-07 DIAGNOSIS — W540XXD Bitten by dog, subsequent encounter: Secondary | ICD-10-CM | POA: Diagnosis not present

## 2016-12-07 DIAGNOSIS — M7989 Other specified soft tissue disorders: Secondary | ICD-10-CM | POA: Diagnosis not present

## 2016-12-07 MED ORDER — AMOXICILLIN-POT CLAVULANATE 875-125 MG PO TABS: 1.0000 | ORAL_TABLET | Freq: Two times a day (BID) | ORAL | 0 refills | Status: DC
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
## 2016-12-07 NOTE — Patient Instructions (Addendum)
Animal Bite Animal bites can range from mild to serious. An animal bite can result in a scratch on the skin, a deep open cut, a puncture of the skin, a crush injury, or tearing away of the skin or a body part. A small bite from a house pet will usually not cause serious problems. However, some animal bites can become infected or injure a bone or other tissue. Bites from certain animals can be more dangerous because of the risk of spreading rabies, which is a serious viral infection. This risk is higher with bites from stray animals or wild animals, such as raccoons, foxes, skunks, and bats. Dogs are responsible for most animal bites. Children are bitten more often than adults. What are the signs or symptoms? Common symptoms of an animal bite include:  Pain.  Bleeding.  Swelling.  Bruising.  How is this diagnosed? This condition may be diagnosed based on a physical exam and medical history. Your health care provider will examine the wound and ask for details about the animal and how the bite happened. You may also have tests, such as:  Blood tests to check for infection or to determine if surgery is needed.  X-rays to check for damage to bones or joints.  Culture test. This uses a sample of fluid from the wound to check for infection.  How is this treated? Treatment varies depending on the location and type of animal bite and your medical history. Treatment may include:  Wound care. This often includes cleaning the wound, flushing the wound with saline solution, and applying a bandage (dressing). Sometimes, the wound is left open to heal because of the high risk of infection. However, in some cases, the wound may be closed with stitches (sutures), staples, skin glue, or adhesive strips.  Antibiotic medicine.  Tetanus shot.  Rabies treatment if the animal could have rabies.  In some cases, bites that have become infected may require IV antibiotics and surgical treatment in the  hospital. Follow these instructions at home: Wound care  Follow instructions from your health care provider about how to take care of your wound. Make sure you: ? Wash your hands with soap and water before you change your dressing. If soap and water are not available, use hand sanitizer. ? Change your dressing as told by your health care provider. ? Leave sutures, skin glue, or adhesive strips in place. These skin closures may need to be in place for 2 weeks or longer. If adhesive strip edges start to loosen and curl up, you may trim the loose edges. Do not remove adhesive strips completely unless your health care provider tells you to do that.  Check your wound every day for signs of infection. Watch for: ? Increasing redness, swelling, or pain. ? Fluid, blood, or pus. General instructions  Take or apply over-the-counter and prescription medicines only as told by your health care provider.  If you were prescribed an antibiotic, take or apply it as told by your health care provider. Do not stop using the antibiotic even if your condition improves.  Keep the injured area raised (elevated) above the level of your heart while you are sitting or lying down, if this is possible.  If directed, apply ice to the injured area. ? Put ice in a plastic bag. ? Place a towel between your skin and the bag. ? Leave the ice on for 20 minutes, 2-3 times per day.  Keep all follow-up visits as told by your health care   provider. This is important. Contact a health care provider if:  You have increasing redness, swelling, or pain at the site of your wound.  You have a general feeling of sickness (malaise).  You feel nauseous or you vomit.  You have pain that does not get better. Get help right away if:  You have a red streak extending away from your wound.  You have fluid, blood, or pus coming from your wound.  You have a fever or chills.  You have trouble moving your injured area.  You have  numbness or tingling extending beyond the wound. This information is not intended to replace advice given to you by your health care provider. Make sure you discuss any questions you have with your health care provider. Document Released: 02/26/2011 Document Revised: 10/18/2015 Document Reviewed: 10/26/2014 Elsevier Interactive Patient Education  2018 Elsevier Inc.     IF you received an x-ray today, you will receive an invoice from [facility]. Please contact [facility] at [phone] with questions or concerns regarding your invoice.   IF you received labwork today, you will receive an invoice from [facility]. Please contact [facility] at [phone] with questions or concerns regarding your invoice.   Our billing staff will not be able to assist you with questions regarding bills from these companies.  You will be contacted with the lab results as soon as they are available. The fastest way to get your results is to activate your My Chart account. Instructions are located on the last page of this paperwork. If you have not heard from us regarding the results in 2 weeks, please contact this office.      

## 2016-12-12 ENCOUNTER — Ambulatory Visit: Payer: BC Managed Care – PPO | Admitting: Physician Assistant

## 2016-12-14 ENCOUNTER — Ambulatory Visit (INDEPENDENT_AMBULATORY_CARE_PROVIDER_SITE_OTHER): Payer: BC Managed Care – PPO | Admitting: Family Medicine

## 2016-12-14 ENCOUNTER — Encounter: Payer: Self-pay | Admitting: Family Medicine

## 2016-12-14 VITALS — BP 99/63 | HR 59 | Temp 98.8°F | Resp 16 | Ht 64.0 in | Wt 141.0 lb

## 2016-12-14 DIAGNOSIS — M79662 Pain in left lower leg: Secondary | ICD-10-CM | POA: Diagnosis not present

## 2016-12-14 DIAGNOSIS — Z4802 Encounter for removal of sutures: Secondary | ICD-10-CM

## 2016-12-14 DIAGNOSIS — W540XXD Bitten by dog, subsequent encounter: Secondary | ICD-10-CM

## 2016-12-14 DIAGNOSIS — M7989 Other specified soft tissue disorders: Secondary | ICD-10-CM

## 2016-12-14 NOTE — Patient Instructions (Addendum)
   IF you received an x-ray today, you will receive an invoice from [facility]. Please contact [facility] at [phone] with questions or concerns regarding your invoice.   IF you received labwork today, you will receive an invoice from [facility]. Please contact [facility] at [phone] with questions or concerns regarding your invoice.   Our billing staff will not be able to assist you with questions regarding bills from these companies.  You will be contacted with the lab results as soon as they are available. The fastest way to get your results is to activate your My Chart account. Instructions are located on the last page of this paperwork. If you have not heard from us regarding the results in 2 weeks, please contact this office.      Edema Edema is an abnormal buildup of fluids in your bodytissues. Edema is somewhatdependent on gravity to pull the fluid to the lowest place in your body. That makes the condition more common in the legs and thighs (lower extremities). Painless swelling of the feet and ankles is common and becomes more likely as you get older. It is also common in looser tissues, like around your eyes. When the affected area is squeezed, the fluid may move out of that spot and leave a dent for a few moments. This dent is called pitting. What are the causes? There are many possible causes of edema. Eating too much salt and being on your feet or sitting for a long time can cause edema in your legs and ankles. Hot weather may make edema worse. Common medical causes of edema include:  Heart failure.  Liver disease.  Kidney disease.  Weak blood vessels in your legs.  Cancer.  An injury.  Pregnancy.  Some medications.  Obesity.  What are the signs or symptoms? Edema is usually painless.Your skin may look swollen or shiny. How is this diagnosed? Your health care provider may be able to diagnose edema by asking about your medical history  and doing a physical exam. You may need to have tests such as X-rays, an electrocardiogram, or blood tests to check for medical conditions that may cause edema. How is this treated? Edema treatment depends on the cause. If you have heart, liver, or kidney disease, you need the treatment appropriate for these conditions. General treatment may include:  Elevation of the affected body part above the level of your heart.  Compression of the affected body part. Pressure from elastic bandages or support stockings squeezes the tissues and forces fluid back into the blood vessels. This keeps fluid from entering the tissues.  Restriction of fluid and salt intake.  Use of a water pill (diuretic). These medications are appropriate only for some types of edema. They pull fluid out of your body and make you urinate more often. This gets rid of fluid and reduces swelling, but diuretics can have side effects. Only use diuretics as directed by your health care provider.  Follow these instructions at home:  Keep the affected body part above the level of your heart when you are lying down.  Do not sit still or stand for prolonged periods.  Do not put anything directly under your knees when lying down.  Do not wear constricting clothing or garters on your upper legs.  Exercise your legs to work the fluid back into your blood vessels. This may help the swelling go down.  Wear elastic bandages or support stockings to reduce ankle swelling as directed by your health   care provider.  Eat a low-salt diet to reduce fluid if your health care provider recommends it.  Only take medicines as directed by your health care provider. Contact a health care provider if:  Your edema is not responding to treatment.  You have heart, liver, or kidney disease and notice symptoms of edema.  You have edema in your legs that does not improve after elevating them.  You have sudden and unexplained weight gain. Get help  right away if:  You develop shortness of breath or chest pain.  You cannot breathe when you lie down.  You develop pain, redness, or warmth in the swollen areas.  You have heart, liver, or kidney disease and suddenly get edema.  You have a fever and your symptoms suddenly get worse. This information is not intended to replace advice given to you by your health care provider. Make sure you discuss any questions you have with your health care provider. Document Released: 06/10/2005 Document Revised: 11/16/2015 Document Reviewed: 04/02/2013 Elsevier Interactive Patient Education  2017 Elsevier Inc.  
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
## 2017-08-05 ENCOUNTER — Encounter: Payer: BC Managed Care – PPO | Admitting: Obstetrics & Gynecology

## 2017-08-11 ENCOUNTER — Ambulatory Visit (INDEPENDENT_AMBULATORY_CARE_PROVIDER_SITE_OTHER): Payer: BC Managed Care – PPO | Admitting: Obstetrics & Gynecology

## 2017-08-11 ENCOUNTER — Encounter: Payer: Self-pay | Admitting: Obstetrics & Gynecology

## 2017-08-11 VITALS — BP 126/82 | Ht 63.5 in | Wt 141.0 lb

## 2017-08-11 DIAGNOSIS — Z01419 Encounter for gynecological examination (general) (routine) without abnormal findings: Secondary | ICD-10-CM

## 2017-08-11 DIAGNOSIS — Z1151 Encounter for screening for human papillomavirus (HPV): Secondary | ICD-10-CM

## 2017-08-11 DIAGNOSIS — L680 Hirsutism: Secondary | ICD-10-CM | POA: Diagnosis not present

## 2017-08-11 DIAGNOSIS — Z30431 Encounter for routine checking of intrauterine contraceptive device: Secondary | ICD-10-CM | POA: Diagnosis not present

## 2017-08-11 NOTE — Progress Notes (Signed)
Misty Russell 11/24/1980 [id number]   History:    37 y.o. G2P2L2 Married.  Stay at home mom.  Daughter 5112, son 36 yo, both doing well.  RP:  Established patient presenting for annual gyn exam   HPI: Well on Mirena IUD, but inserted 02/2011, more than a year overdue for change.  Not using condoms.  No pain with IC.  No abnormal vaginal bleeding.  Normal vaginal secretions.  No pelvic pain.  Complains of progressive increase facial hair growth as well as between the breasts.  Patient has been plucking the hair.  No acne.  Patient has a tendency for cluster migraines, but doing well on Mirena IUD.  Urine/BMs wnl.  Breasts wnl.    Past medical history,surgical history, family history and social history were all reviewed and documented in the EPIC chart.  Gynecologic History No LMP recorded. Patient is not currently having periods (Reason: IUD). Contraception:  Mirena IUD, but overdue Last Pap: 06/2016. Results were: Negative.  H/O ASCUS/HR HPV pos. Last mammogram: Never Bone Density: Never Colonoscopy: Never  Obstetric History OB History  Gravida Para Term Preterm AB Living  2 2       2   SAB TAB Ectopic Multiple Live Births               # Outcome Date GA Lbr Len/2nd Weight Sex Delivery Anes PTL Lv  2 Para           1 Para                ROS: A ROS was performed and pertinent positives and negatives are included in the history.  GENERAL: No fevers or chills. HEENT: No change in vision, no earache, sore throat or sinus congestion. NECK: No pain or stiffness. CARDIOVASCULAR: No chest pain or pressure. No palpitations. PULMONARY: No shortness of breath, cough or wheeze. GASTROINTESTINAL: No abdominal pain, nausea, vomiting or diarrhea, melena or bright red blood per rectum. GENITOURINARY: No urinary frequency, urgency, hesitancy or dysuria. MUSCULOSKELETAL: No joint or muscle pain, no back pain, no recent trauma. DERMATOLOGIC: No rash, no itching, no lesions. ENDOCRINE: No polyuria,  polydipsia, no heat or cold intolerance. No recent change in weight. HEMATOLOGICAL: No anemia or easy bruising or bleeding. NEUROLOGIC: No headache, seizures, numbness, tingling or weakness. PSYCHIATRIC: No depression, no loss of interest in normal activity or change in sleep pattern.     Exam:   BP 126/82   Ht 5' 3.5" (1.613 m)   Wt 141 lb (64 kg)   BMI 24.59 kg/m   Body mass index is 24.59 kg/m.  General appearance : Well developed well nourished female. No acute distress HEENT: Eyes: no retinal hemorrhage or exudates,  Neck supple, trachea midline, no carotid bruits, no thyroidmegaly Lungs: Clear to auscultation, no rhonchi or wheezes, or rib retractions  Heart: Regular rate and rhythm, no murmurs or gallops Breast:Examined in sitting and supine position were symmetrical in appearance, no palpable masses or tenderness,  no skin retraction, no nipple inversion, no nipple discharge, no skin discoloration, no axillary or supraclavicular lymphadenopathy Abdomen: no palpable masses or tenderness, no rebound or guarding Extremities: no edema or skin discoloration or tenderness  Pelvic: Vulva: Normal             Vagina: No gross lesions or discharge  Cervix: No gross lesions or discharge.  IUD strings not seen.  Pap/HR HPV done.  Uterus  AV, normal size, shape and consistency, non-tender and mobile  Adnexa  Without masses or tenderness  Anus: Normal   Assessment/Plan:  37 y.o. female for annual exam   1. Encounter for routine gynecological examination with Papanicolaou smear of cervix Normal gynecologic exam.  Pap with high risk HPV done.  Breast exam normal.  Continue with regular physical activity.  Health labs with family physician.  2. Encounter for routine checking of intrauterine contraceptive device (IUD) Overdue for Mirena IUD change.  Strict condom use for the next 2 weeks.  Will follow up for Mirena IUD change under ultrasound guidance given that the strings are not  visible.  Will do a urine pregnancy test prior to IUD change. - US Transvaginal Non-OB; Future  3. Female hirsutism Mild facial hirsutism and between the breasts.  Will rule out abnormal female hormones.  If testosterone and DHEA within normal, patient will decide between continuing to pluck or doing laser treatment. - Testos,Total,Free and SHBG (Female) - DHEA-sulfate  Counseling on above issues more than 50% for 10 minutes.  Genia Del MD, 9:27 AM 08/11/2017

## 2017-08-11 NOTE — Patient Instructions (Signed)
1. Encounter for routine gynecological examination with Papanicolaou smear of cervix Normal gynecologic exam.  Pap with high risk HPV done.  Breast exam normal.  Continue with regular physical activity.  Health labs with family physician.  2. Encounter for routine checking of intrauterine contraceptive device (IUD) Overdue for Mirena IUD change.  Strict condom use for the next 2 weeks.  Will follow up for Mirena IUD change under ultrasound guidance given that the strings are not visible.  Will do a urine pregnancy test prior to IUD change. - US Transvaginal Non-OB; Future  3. Female hirsutism Mild facial hirsutism and between the breasts.  Will rule out abnormal female hormones.  If testosterone and DHEA within normal, patient will decide between continuing to pluck or doing laser treatment. - Testos,Total,Free and SHBG (Female) - DHEA-sulfate  [place]be, it was a pleasure meeting your today!  I will inform you of your results as soon as they are available.  See you again soon for the change in Mirena IUD.  Health Maintenance, Female Adopting a healthy lifestyle and getting preventive care can go a long way to promote health and wellness. Talk with your health care provider about what schedule of regular examinations is right for you. This is a good chance for you to check in with your provider about disease prevention and staying healthy. In between checkups, there are plenty of things you can do on your own. Experts have done a lot of research about which lifestyle changes and preventive measures are most likely to keep you healthy. Ask your health care provider for more information. Weight and diet Eat a healthy diet  Be sure to include plenty of vegetables, fruits, low-fat dairy products, and lean protein.  Do not eat a lot of foods high in solid fats, added sugars, or salt.  Get regular exercise. This is one of the most important things you can do for your health. ? Most adults should  exercise for at least 150 minutes each week. The exercise should increase your heart rate and make you sweat (moderate-intensity exercise). ? Most adults should also do strengthening exercises at least twice a week. This is in addition to the moderate-intensity exercise.  Maintain a healthy weight  Body mass index (BMI) is a measurement that can be used to identify possible weight problems. It estimates body fat based on height and weight. Your health care provider can help determine your BMI and help you achieve or maintain a healthy weight.  For females 40 years of age and older: ? A BMI below 18.5 is considered underweight. ? A BMI of 18.5 to 24.9 is normal. ? A BMI of 25 to 29.9 is considered overweight. ? A BMI of 30 and above is considered obese.  Watch levels of cholesterol and blood lipids  You should start having your blood tested for lipids and cholesterol at 36 years of age, then have this test every 5 years.  You may need to have your cholesterol levels checked more often if: ? Your lipid or cholesterol levels are high. ? You are older than 36 years of age. ? You are at high risk for heart disease.  Cancer screening Lung Cancer  Lung cancer screening is recommended for adults 87-42 years old who are at high risk for lung cancer because of a history of smoking.  A yearly low-dose CT scan of the lungs is recommended for people who: ? Currently smoke. ? Have quit within the past 15 years. ? Have at  least a 30-pack-year history of smoking. A pack year is smoking an average of one pack of cigarettes a day for 1 year.  Yearly screening should continue until it has been 15 years since you quit.  Yearly screening should stop if you develop a health problem that would prevent you from having lung cancer treatment.  Breast Cancer  Practice breast self-awareness. This means understanding how your breasts normally appear and feel.  It also means doing regular breast  self-exams. Let your health care provider know about any changes, no matter how small.  If you are in your 20s or 30s, you should have a clinical breast exam (CBE) by a health care provider every 1-3 years as part of a regular health exam.  If you are 53 or older, have a CBE every year. Also consider having a breast X-ray (mammogram) every year.  If you have a family history of breast cancer, talk to your health care provider about genetic screening.  If you are at high risk for breast cancer, talk to your health care provider about having an MRI and a mammogram every year.  Breast cancer gene (BRCA) assessment is recommended for women who have family members with BRCA-related cancers. BRCA-related cancers include: ? Breast. ? Ovarian. ? Tubal. ? Peritoneal cancers.  Results of the assessment will determine the need for genetic counseling and BRCA1 and BRCA2 testing.  Cervical Cancer Your health care provider may recommend that you be screened regularly for cancer of the pelvic organs (ovaries, uterus, and vagina). This screening involves a pelvic examination, including checking for microscopic changes to the surface of your cervix (Pap test). You may be encouraged to have this screening done every 3 years, beginning at age 55.  For women ages 40-65, health care providers may recommend pelvic exams and Pap testing every 3 years, or they may recommend the Pap and pelvic exam, combined with testing for human papilloma virus (HPV), every 5 years. Some types of HPV increase your risk of cervical cancer. Testing for HPV may also be done on women of any age with unclear Pap test results.  Other health care providers may not recommend any screening for nonpregnant women who are considered low risk for pelvic cancer and who do not have symptoms. Ask your health care provider if a screening pelvic exam is right for you.  If you have had past treatment for cervical cancer or a condition that could  lead to cancer, you need Pap tests and screening for cancer for at least 20 years after your treatment. If Pap tests have been discontinued, your risk factors (such as having a new sexual partner) need to be reassessed to determine if screening should resume. Some women have medical problems that increase the chance of getting cervical cancer. In these cases, your health care provider may recommend more frequent screening and Pap tests.  Colorectal Cancer  This type of cancer can be detected and often prevented.  Routine colorectal cancer screening usually begins at 36 years of age and continues through 36 years of age.  Your health care provider may recommend screening at an earlier age if you have risk factors for colon cancer.  Your health care provider may also recommend using home test kits to check for hidden blood in the stool.  A small camera at the end of a tube can be used to examine your colon directly (sigmoidoscopy or colonoscopy). This is done to check for the earliest forms of colorectal cancer.  Routine screening usually begins at age 52.  Direct examination of the colon should be repeated every 5-10 years through 36 years of age. However, you may need to be screened more often if early forms of precancerous polyps or small growths are found.  Skin Cancer  Check your skin from head to toe regularly.  Tell your health care provider about any new moles or changes in moles, especially if there is a change in a mole's shape or color.  Also tell your health care provider if you have a mole that is larger than the size of a pencil eraser.  Always use sunscreen. Apply sunscreen liberally and repeatedly throughout the day.  Protect yourself by wearing long sleeves, pants, a wide-brimmed hat, and sunglasses whenever you are outside.  Heart disease, diabetes, and high blood pressure  High blood pressure causes heart disease and increases the risk of stroke. High blood pressure  is more likely to develop in: ? People who have blood pressure in the high end of the normal range (130-139/85-89 mm Hg). ? People who are overweight or obese. ? People who are African American.  If you are 43-58 years of age, have your blood pressure checked every 3-5 years. If you are 104 years of age or older, have your blood pressure checked every year. You should have your blood pressure measured twice-once when you are at a hospital or clinic, and once when you are not at a hospital or clinic. Record the average of the two measurements. To check your blood pressure when you are not at a hospital or clinic, you can use: ? An automated blood pressure machine at a pharmacy. ? A home blood pressure monitor.  If you are between 30 years and 89 years old, ask your health care provider if you should take aspirin to prevent strokes.  Have regular diabetes screenings. This involves taking a blood sample to check your fasting blood sugar level. ? If you are at a normal weight and have a low risk for diabetes, have this test once every three years after 36 years of age. ? If you are overweight and have a high risk for diabetes, consider being tested at a younger age or more often. Preventing infection Hepatitis B  If you have a higher risk for hepatitis B, you should be screened for this virus. You are considered at high risk for hepatitis B if: ? You were born in a country where hepatitis B is common. Ask your health care provider which countries are considered high risk. ? Your parents were born in a high-risk country, and you have not been immunized against hepatitis B (hepatitis B vaccine). ? You have HIV or AIDS. ? You use needles to inject street drugs. ? You live with someone who has hepatitis B. ? You have had sex with someone who has hepatitis B. ? You get hemodialysis treatment. ? You take certain medicines for conditions, including cancer, organ transplantation, and autoimmune  conditions.  Hepatitis C  Blood testing is recommended for: ? Everyone born from [birth year] through 1964. ? Anyone with known risk factors for hepatitis C.  Sexually transmitted infections (STIs)  You should be screened for sexually transmitted infections (STIs) including gonorrhea and chlamydia if: ? You are sexually active and are younger than 36 years of age. ? You are older than 36 years of age and your health care provider tells you that you are at risk for this type of infection. ? Your sexual activity  has changed since you were last screened and you are at an increased risk for chlamydia or gonorrhea. Ask your health care provider if you are at risk.  If you do not have HIV, but are at risk, it may be recommended that you take a prescription medicine daily to prevent HIV infection. This is called pre-exposure prophylaxis (PrEP). You are considered at risk if: ? You are sexually active and do not regularly use condoms or know the HIV status of your partner(s). ? You take drugs by injection. ? You are sexually active with a partner who has HIV.  Talk with your health care provider about whether you are at high risk of being infected with HIV. If you choose to begin PrEP, you should first be tested for HIV. You should then be tested every 3 months for as long as you are taking PrEP. Pregnancy  If you are premenopausal and you may become pregnant, ask your health care provider about preconception counseling.  If you may become pregnant, take 400 to 800 micrograms (mcg) of folic acid every day.  If you want to prevent pregnancy, talk to your health care provider about birth control (contraception). Osteoporosis and menopause  Osteoporosis is a disease in which the bones lose minerals and strength with aging. This can result in serious bone fractures. Your risk for osteoporosis can be identified using a bone density scan.  If you are 6 years of age or older, or if you are at risk for  osteoporosis and fractures, ask your health care provider if you should be screened.  Ask your health care provider whether you should take a calcium or vitamin D supplement to lower your risk for osteoporosis.  Menopause may have certain physical symptoms and risks.  Hormone replacement therapy may reduce some of these symptoms and risks. Talk to your health care provider about whether hormone replacement therapy is right for you. Follow these instructions at home:  Schedule regular health, dental, and eye exams.  Stay current with your immunizations.  Do not use any tobacco products including cigarettes, chewing tobacco, or electronic cigarettes.  If you are pregnant, do not drink alcohol.  If you are breastfeeding, limit how much and how often you drink alcohol.  Limit alcohol intake to no more than 1 drink per day for nonpregnant women. One drink equals 12 ounces of beer, 5 ounces of wine, or 1 ounces of hard liquor.  Do not use street drugs.  Do not share needles.  Ask your health care provider for help if you need support or information about quitting drugs.  Tell your health care provider if you often feel depressed.  Tell your health care provider if you have ever been abused or do not feel safe at home. This information is not intended to replace advice given to you by your health care provider. Make sure you discuss any questions you have with your health care provider. Document Released: 12/24/2010 Document Revised: 11/16/2015 Document Reviewed: 03/14/2015 Elsevier Interactive Patient Education  [employer].

## 2017-08-14 LAB — PAP, TP IMAGING W/ HPV RNA, RFLX HPV TYPE 16,18/45: HPV DNA HIGH RISK: NOT DETECTED

## 2017-08-14 LAB — TESTOS,TOTAL,FREE AND SHBG (FEMALE)
Free Testosterone: 2 pg/mL (ref 0.1–6.4)
Sex Hormone Binding: 25 nmol/L (ref 17–124)
Testosterone, Total, LC-MS-MS: 11 ng/dL (ref 2–45)

## 2017-08-14 LAB — DHEA-SULFATE: DHEA-SO4: 174 ug/dL (ref 23–266)

## 2017-08-25 ENCOUNTER — Other Ambulatory Visit: Payer: BC Managed Care – PPO

## 2017-08-25 ENCOUNTER — Ambulatory Visit: Payer: BC Managed Care – PPO | Admitting: Obstetrics & Gynecology

## 2017-09-22 ENCOUNTER — Ambulatory Visit: Payer: BC Managed Care – PPO | Admitting: Obstetrics & Gynecology

## 2017-09-22 ENCOUNTER — Other Ambulatory Visit: Payer: BC Managed Care – PPO

## 2018-01-02 ENCOUNTER — Ambulatory Visit: Payer: BC Managed Care – PPO | Admitting: Obstetrics & Gynecology

## 2018-01-05 ENCOUNTER — Encounter: Payer: Self-pay | Admitting: Women's Health

## 2018-01-05 ENCOUNTER — Ambulatory Visit (INDEPENDENT_AMBULATORY_CARE_PROVIDER_SITE_OTHER): Payer: BC Managed Care – PPO | Admitting: Women's Health

## 2018-01-05 VITALS — BP 126/82

## 2018-01-05 DIAGNOSIS — N898 Other specified noninflammatory disorders of vagina: Secondary | ICD-10-CM | POA: Diagnosis not present

## 2018-01-05 NOTE — Progress Notes (Signed)
37 year old MBF G2, P2 presents with complaint of questionable painless perineal bump that has been there for several days that was present with UTI symptoms.  At the same time of urinary symptoms of increased urinary frequency with burning at the beginning of the stream of urination, no pain at the end of the stream, no abdominal/back pain, fever,  vaginal itching or odor.  Reports good relationship with husband.  No history of HSV.  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  Was seen at [facility] 3 days ago after work where HSV 1, 2 IgG, vaginal culture, and GC/Chlamydia culture were taken and are pending.   Was treated 01/02/2018 office visit for pelvic inflammatory disease and UTI with Cipro 500 for 10 days, ceftriaxone and Flagyl .  Prescription for Valtrex given but did not start.  9/12   Mirena IUD amenorrheic  Exam: Appears well.  No CVAT.  Abdomen soft non-tender, no rebound or radiation of pain, external genitalia, left labia small folliculitis, right inner labia several ulcerated areas nonpainful noted.  Rule out HSV  Plan: Called [facility] for results they will fax results when available.  Will start Valtrex 500 twice daily as prescribed from urgent care.  Reviewed possible HSV 1, reassurance given, increase plain water, UTI prevention discussed.  Instructed to schedule appointment with Dr. Seymour BarsLavoie for Mirena IUD to be removed and replaced.

## 2018-01-13 ENCOUNTER — Telehealth: Payer: Self-pay | Admitting: *Deleted

## 2018-01-13 MED ORDER — FLUCONAZOLE 150 MG PO TABS: 150.0000 mg | ORAL_TABLET | Freq: Once | ORAL | 0 refills | Status: AC

## 2018-01-13 NOTE — Telephone Encounter (Signed)
Patient informed, Rx sent.  

## 2018-01-13 NOTE — Telephone Encounter (Signed)
Patient called receive results from urgent care positive HSV-2 has valtrex Rx to take no active lesion she can see. However she is still c/o pain in rectal has used preparation H. and hydrocortisone and no relief, does not notice rectal lesion, more pain at bedtime itching and tingling feeling, she is still taking Valtrex twice daily,she asked if you have any other advice? Please advise

## 2018-01-13 NOTE — Telephone Encounter (Signed)
Please call, could try Diflucan 1501 dose.  She was positive for yeast and was treated with Terazol.  Have her call if continued problems with itching.  Best to continue taking the Valtrex twice daily until tingling and discomfort gone.

## 2018-01-15 ENCOUNTER — Ambulatory Visit: Payer: BC Managed Care – PPO | Admitting: Obstetrics & Gynecology

## 2018-01-15 ENCOUNTER — Encounter: Payer: Self-pay | Admitting: Obstetrics & Gynecology

## 2018-01-15 ENCOUNTER — Telehealth: Payer: Self-pay | Admitting: *Deleted

## 2018-01-15 VITALS — BP 124/78

## 2018-01-15 DIAGNOSIS — T8332XA Displacement of intrauterine contraceptive device, initial encounter: Secondary | ICD-10-CM

## 2018-01-15 DIAGNOSIS — Z113 Encounter for screening for infections with a predominantly sexual mode of transmission: Secondary | ICD-10-CM

## 2018-01-15 DIAGNOSIS — T8332XD Displacement of intrauterine contraceptive device, subsequent encounter: Secondary | ICD-10-CM

## 2018-01-15 DIAGNOSIS — Z8619 Personal history of other infectious and parasitic diseases: Secondary | ICD-10-CM | POA: Diagnosis not present

## 2018-01-15 DIAGNOSIS — L292 Pruritus vulvae: Secondary | ICD-10-CM

## 2018-01-15 DIAGNOSIS — F419 Anxiety disorder, unspecified: Secondary | ICD-10-CM

## 2018-01-15 LAB — WET PREP FOR TRICH, YEAST, CLUE

## 2018-01-15 MED ORDER — ALPRAZOLAM 0.25 MG PO TABS: 0.2500 mg | ORAL_TABLET | Freq: Every evening | ORAL | 0 refills | Status: DC | PRN

## 2018-01-15 MED ORDER — VALACYCLOVIR HCL 500 MG PO TABS: 500.0000 mg | ORAL_TABLET | Freq: Every day | ORAL | 3 refills | Status: DC

## 2018-01-15 MED ORDER — CLOBETASOL PROPIONATE 0.05 % EX OINT: 1.0000 "application " | TOPICAL_OINTMENT | Freq: Every day | CUTANEOUS | 0 refills | Status: DC

## 2018-01-15 MED ORDER — FLUCONAZOLE 150 MG PO TABS: 150.0000 mg | ORAL_TABLET | Freq: Every day | ORAL | 1 refills | Status: DC

## 2018-01-15 MED ORDER — FLUCONAZOLE 150 MG PO TABS: 150.0000 mg | ORAL_TABLET | Freq: Every day | ORAL | 1 refills | Status: AC

## 2018-01-15 MED ORDER — CLOBETASOL PROPIONATE 0.05 % EX OINT: 1.0000 "application " | TOPICAL_OINTMENT | Freq: Every day | CUTANEOUS | 0 refills | Status: AC

## 2018-01-15 NOTE — Telephone Encounter (Signed)
-----   Message from Genia DelMarie-Lyne Lavoie, MD sent at 01/15/2018  9:19 AM EDT ----- Regarding: Psychotherapy suggestions High stress job, recent Dx of genital herpes.  Needs counseling, please help organize.

## 2018-01-15 NOTE — Patient Instructions (Signed)
1. History of herpes genitalis Probable primary infection with HSV 2 on vulva in early July 2019.  Patient currently on Valtrex 500 mg twice a day.  Still awaiting the report confirmation of HSV 2 positive.  No herpes lesions seen on exam today.  Management of genital herpes discussed with patient.  Decision to start on Valtrex prophylaxis for 1 year if diagnosis confirmed.  Will do HSV 1 and 2 IgG today.  2. Screen for STD (sexually transmitted disease) Strict condom use recommended. - HIV antibody (with reflex) - RPR - Hepatitis C Antibody - Hepatitis B Surface AntiGEN - HSV(herpes simplex vrs) 1+2 ab-IgG  3. Vulvar itching Wet prep negative, but will still treat with fluconazole 1 tablet daily for 3 days.  Clobetasol ointment also prescribed for vulvitis on exam.  Will apply a thin layer on the affected posterior vulva daily for 2 weeks. - WET PREP FOR TRICH, YEAST, CLUE  4. Intrauterine contraceptive device threads lost, subsequent encounter Overdue to change her Mirena IUD and strings lost.  Follow-up for IUD removal under ultrasound guidance and new IUD insertion.  Use condoms in the meantime. F/U Pelvic US for removal.  5. Anxiety Psychotherapy  Other orders - ALPRAZolam (XANAX) 0.25 MG tablet; Take 0.25 mg by mouth at bedtime as needed for anxiety. - fluconazole (DIFLUCAN) 150 MG tablet; Take 1 tablet (150 mg total) by mouth daily for 3 days. - clobetasol ointment (TEMOVATE) 0.05 %; Apply 1 application topically daily for 14 days. Thin layer on affected vulva.  Fritzie, it was a pleasure seeing you today!  I will inform you of your results as soon as they are available.

## 2018-01-15 NOTE — Telephone Encounter (Signed)
Names given for therapist number [phone]. Pt said she need Rx for valtrex 500 mg tablet also to take daily, asked that the xanax 0.25 mg #30, diflucan Rx and clobetasol cream 0.05% be sent to another pharmacy. All Rx sent, will xanax called from today's visit.

## 2018-01-15 NOTE — Progress Notes (Signed)
Misty MillerCherish B Russell 06/10/1981 [id number]        37 y.o.  G2P2L2 Married.  Paralegal.  RP: Recent primo-infection of genital HSV  HPI: Seen at urgent care January 02, 2018.  HSV type II culture came back positive per patient, but still trying to obtain the report confirmation.  Patient currently on Valtrex treatment.  Gonorrhea and Chlamydia were negative.  Was treated for PID and cystitis at that visit with ceftriaxone/Flagyl and Cipro.  Rest of STD work-up not done.  Seen here by Misty RowanNancy Russell on July 15th 2019.  Currently no pelvic pain, but occasional positional discomfort.  No abnormal discharge, but complaining of some itching at the vulva and towards buttocks which is persisting after Terazol cream treatment.  No urinary tract infection symptoms.  No fever.  Husband might have had labial cold sores, but uncertain.  Husband denies extramarital sex and patient as well.   OB History  Gravida Para Term Preterm AB Living  2 2       2   SAB TAB Ectopic Multiple Live Births               # Outcome Date GA Lbr Len/2nd Weight Sex Delivery Anes PTL Lv  2 Para           1 Para             Past medical history,surgical history, problem list, medications, allergies, family history and social history were all reviewed and documented in the EPIC chart.   Directed ROS with pertinent positives and negatives documented in the history of present illness/assessment and plan.  Exam:  Vitals:   01/15/18 0844  BP: 124/78   General appearance:  Normal  Abdomen: Soft, not distended, nontender, no mass felt.  Gynecologic exam: Vulva:  Erythema at posterior vulva.  Speculum:  Cervix normal, strings not visible.  Increased discharge.  Wet prep done.  Bimanual exam:  Uterus AV, mobile, NT, no adnexal mass felt and nontender bilaterally.  Wet prep negative   Assessment/Plan:  37 y.o. G2P2   1. History of herpes genitalis Probable primary infection with HSV 2 on vulva in early July 2019.  Patient  currently on Valtrex 500 mg twice a day.  Still awaiting the report confirmation of HSV 2 positive.  No herpes lesions seen on exam today.  Management of genital herpes discussed with patient.  Decision to start on Valtrex prophylaxis for 1 year if diagnosis confirmed.  Will do HSV 1 and 2 IgG today.  2. Screen for STD (sexually transmitted disease) Strict condom use recommended. - HIV antibody (with reflex) - RPR - Hepatitis C Antibody - Hepatitis B Surface AntiGEN - HSV(herpes simplex vrs) 1+2 ab-IgG  3. Vulvar itching Wet prep negative, but will still treat with fluconazole 1 tablet daily for 3 days.  Clobetasol ointment also prescribed for vulvitis on exam.  Will apply a thin layer on the affected posterior vulva daily for 2 weeks. - WET PREP FOR TRICH, YEAST, CLUE  4. Intrauterine contraceptive device threads lost, subsequent encounter Overdue to change her Mirena IUD and strings lost.  Follow-up for IUD removal under ultrasound guidance and new IUD insertion.  Use condoms in the meantime. F/U Pelvic US for removal.  5. Anxiety Psychotherapy  Other orders - ALPRAZolam (XANAX) 0.25 MG tablet; Take 0.25 mg by mouth at bedtime as needed for anxiety. - fluconazole (DIFLUCAN) 150 MG tablet; Take 1 tablet (150 mg total) by mouth daily for 3 days. -  clobetasol ointment (TEMOVATE) 0.05 %; Apply 1 application topically daily for 14 days. Thin layer on affected vulva.  Counseling on above issues and coordination of care more than 50% for 25 minutes.  Genia Del MD, 8:50 AM 01/15/2018

## 2018-01-16 ENCOUNTER — Telehealth: Payer: Self-pay | Admitting: *Deleted

## 2018-01-16 LAB — HEPATITIS C ANTIBODY
Hepatitis C Ab: NONREACTIVE
SIGNAL TO CUT-OFF: 0.02 (ref <1.00)

## 2018-01-16 LAB — FLUORESCENT TREPONEMAL AB(FTA)-IGG-BLD: FLUORESCENT TREPONEMAL ABS: REACTIVE — AB

## 2018-01-16 LAB — HSV(HERPES SIMPLEX VRS) I + II AB-IGG
HAV 1 IGG,TYPE SPECIFIC AB: <0.9 index
HSV 2 IGG,TYPE SPECIFIC AB: 7.53 index — ABNORMAL HIGH

## 2018-01-16 LAB — HEPATITIS B SURFACE ANTIGEN: Hepatitis B Surface Ag: NONREACTIVE

## 2018-01-16 LAB — RPR: RPR Ser Ql: REACTIVE — AB

## 2018-01-16 LAB — HIV ANTIBODY (ROUTINE TESTING W REFLEX): HIV: NONREACTIVE

## 2018-01-16 LAB — RPR TITER

## 2018-01-16 NOTE — Telephone Encounter (Signed)
Agree with Diflucan only at this time.  May be allergic/sensitive to Clobetasol, do not use.

## 2018-01-16 NOTE — Telephone Encounter (Signed)
Patient called was prescribed several medication yesterday used the clobetasol ointment last night and it made vaginal itching worse, states it was horrible she had to get out of bed and sit in a warm bath. She used more ointment today and still itching bad, I told her to hold off on ointment and just use diflucan tablet for now and monitor. I told her I would relay this to you to see if you had any other suggestions. Please advise

## 2018-01-16 NOTE — Telephone Encounter (Signed)
Patient informed. 

## 2018-01-21 ENCOUNTER — Telehealth: Payer: Self-pay

## 2018-01-21 MED ORDER — TERCONAZOLE 0.8 % VA CREA: 1.0000 | TOPICAL_CREAM | Freq: Every day | VAGINAL | 0 refills | Status: DC

## 2018-01-21 NOTE — Telephone Encounter (Signed)
Patient advised. Rx sent. 

## 2018-01-21 NOTE — Telephone Encounter (Signed)
Treponema positive.  Patient has Syphilis.  Difficult to say for how she's had it. RPR neg in 2012.  Needs treatment with Benzathine Penoicilli G IM.  Partner needs treatment as well.

## 2018-01-21 NOTE — Telephone Encounter (Signed)
Patient was informed of positive confirmatory test. Also that test in 02/20/2011 was negative.  She [facility] not [facility]. [facility].  Please place medication order for her to be able to go there and have the 3 weekly injections.    (this medication is not available through local pharmacies. [facility].)

## 2018-01-21 NOTE — Telephone Encounter (Signed)
Patient with OV 01/15/18. She said you prescribed ointment for itching she was having and she suspected she had a reaction to it. She discontinued it as you recommended. Since then she said that her vulvar area is peeling like with a sunburn and she is still itching especially hs.  She asked is there anything else you can recommend that might bring her some relief?

## 2018-01-21 NOTE — Telephone Encounter (Signed)
Follow up appointment scheduled with you for 02/20/18.  Have you put the orders in?  Don't see them.  I did tell her she would go once weekly for 3 weeks to [facility] for injection.

## 2018-01-21 NOTE — Telephone Encounter (Signed)
Patient called in voice mail asking us to call her.  It looks like her confirmatory test for RPR is back.   When I initially told her the RPR was positive she asked about if it could be like the HSV and been lying dormant along time?    What to advise about this result?

## 2018-01-21 NOTE — Telephone Encounter (Signed)
Ok will put the order in.  Could you schedule her with me to discuss/manage in 4 weeks.

## 2018-01-21 NOTE — Telephone Encounter (Addendum)
Per conversation with Dr. Mackey BirchwoodML.  Benzathine Penicillin G 2.4 million units to have one inj IM weekly x 3 weeks.

## 2018-01-21 NOTE — Telephone Encounter (Signed)
I called Misty Russell and let her know that I need to speak with Dr. Mackey BirchwoodML to confirm that [facility] will be able to reach her when Misty Russell shows for injection.  I will call her tomorrow and we will get her into [facility] tomorrow.

## 2018-01-21 NOTE — Telephone Encounter (Signed)
Please send a Terazol 3 prescription for possible yeast.  Can also cover worst areas with vaseline for symptom relief and decrease rubbing.Marland Kitchen..Marland Kitchen

## 2018-01-22 ENCOUNTER — Telehealth: Payer: Self-pay

## 2018-01-22 ENCOUNTER — Inpatient Hospital Stay (HOSPITAL_COMMUNITY)
Admission: AD | Admit: 2018-01-22 | Discharge: 2018-01-22 | Disposition: A | Discharge status: Home / Self Care | Payer: BC Managed Care – PPO | Source: Ambulatory Visit | Attending: Obstetrics and Gynecology | Admitting: Obstetrics and Gynecology

## 2018-01-22 DIAGNOSIS — A539 Syphilis, unspecified: Secondary | ICD-10-CM | POA: Insufficient documentation

## 2018-01-22 MED ORDER — PENICILLIN G BENZATHINE 2400000 UNIT/4ML IM SUSP
2.4000 10*6.[IU] | INTRAMUSCULAR | Status: DC
  Administered 2018-01-22: 2.4 10*6.[IU] via INTRAMUSCULAR
  Filled 2018-01-22: qty 1

## 2018-01-22 MED FILL — Penicillin G Benzathine Intramuscular Susp 2400000 Unit/4ML: INTRAMUSCULAR | Qty: 4 | Status: AC

## 2018-01-22 NOTE — Telephone Encounter (Signed)
When I informed patient of result I explained that her husband will need to be treated as well. I explained options of going to Urgent Care, Private PCP of the [facility]. I explained how the [facility] process works. She said he would not want to go to the [facility] either and he will see his MD.

## 2018-01-22 NOTE — Telephone Encounter (Signed)
Mr. Misty Russell called for information related to recent positive RPR titer.  Information provided.

## 2018-01-28 ENCOUNTER — Ambulatory Visit: Payer: BC Managed Care – PPO | Admitting: Obstetrics & Gynecology

## 2018-01-28 ENCOUNTER — Ambulatory Visit (INDEPENDENT_AMBULATORY_CARE_PROVIDER_SITE_OTHER): Payer: BC Managed Care – PPO

## 2018-01-28 ENCOUNTER — Other Ambulatory Visit: Payer: Self-pay | Admitting: Obstetrics & Gynecology

## 2018-01-28 DIAGNOSIS — Z30431 Encounter for routine checking of intrauterine contraceptive device: Secondary | ICD-10-CM

## 2018-01-28 DIAGNOSIS — Z30011 Encounter for initial prescription of contraceptive pills: Secondary | ICD-10-CM

## 2018-01-28 DIAGNOSIS — T8332XA Displacement of intrauterine contraceptive device, initial encounter: Secondary | ICD-10-CM

## 2018-01-28 DIAGNOSIS — Z30432 Encounter for removal of intrauterine contraceptive device: Secondary | ICD-10-CM

## 2018-01-28 DIAGNOSIS — T8332XD Displacement of intrauterine contraceptive device, subsequent encounter: Secondary | ICD-10-CM

## 2018-01-28 MED ORDER — NORETHIN ACE-ETH ESTRAD-FE 1-20 MG-MCG(24) PO TABS: 1.0000 | ORAL_TABLET | Freq: Every day | ORAL | 4 refills | Status: DC

## 2018-01-28 NOTE — Progress Notes (Signed)
    Misty MillerCherish B Russell 07/21/1980 [id number]        37 y.o.  G2P2L2 married  RP: Mirena IUD removal/IUD strings lost/Contraception  HPI: Mirena IUD inserted in September 2012, overdue for change.  IUD strings not seen on annual gynecologic exam February 2019, as well as on July 2019.  Recent history of positive HSV-2 genitally.  Patient prefers not to continue on Mirena IUD, but instead started on birth control pills.   OB History  Gravida Para Term Preterm AB Living  2 2       2   SAB TAB Ectopic Multiple Live Births               # Outcome Date GA Lbr Len/2nd Weight Sex Delivery Anes PTL Lv  2 Para           1 Para             Past medical history,surgical history, problem list, medications, allergies, family history and social history were all reviewed and documented in the EPIC chart.   Directed ROS with pertinent positives and negatives documented in the history of present illness/assessment and plan.  Exam:  There were no vitals filed for this visit. General appearance:  Normal  Pelvic US today: T/V images.  Uterus anteverted homogeneous measuring 7.28 x 3.76 x 2.82 cm.  Endometrial lining normal at 3.9 mm.  IUD seen in normal intrauterine position.  Right and left ovaries normal.  No apparent mass in the right and left adnexa.  No free fluid in the posterior cul-de-sac.  IUD removal images by ultrasound.  Mirena IUD removal:  Vulva normal.  Speculum:  Cerivx/vagina normal.  Strings not visible.  IUD strings grasped in the Endocervical canal with the IUD removal clamp.  IUD complete/intact shown to patient and discarded.    Assessment/Plan:  37 y.o. G2P2   1. Encounter for IUD removal Easy removal of Mirena IUD with strings grasped in the endocervical canal.  IUD intact and complete, shown to patient and discarded.  2. Intrauterine contraceptive device threads lost, subsequent encounter IUD removed.  3. Encounter for initial prescription of contraceptive pills Decision  to start on Loestrin 24 FE 1/20 after counseling.  No contraindication to birth control pills.  Usage, risks and benefits reviewed with patient.  Prescription sent to pharmacy.  Other orders - Norethindrone Acetate-Ethinyl Estrad-FE (LOESTRIN 24 FE) 1-20 MG-MCG(24) tablet; Take 1 tablet by mouth daily.  Counseling on above issues and coordination of care more than 50% for 15 minutes.  Misty DelMarie-Lyne Wyllow Seigler MD, 12:21 PM 01/28/2018

## 2018-01-29 ENCOUNTER — Inpatient Hospital Stay (HOSPITAL_COMMUNITY)
Admission: AD | Admit: 2018-01-29 | Discharge: 2018-01-29 | Disposition: A | Discharge status: Home / Self Care | Payer: BC Managed Care – PPO | Source: Ambulatory Visit | Attending: Obstetrics & Gynecology | Admitting: Obstetrics & Gynecology

## 2018-01-29 ENCOUNTER — Encounter (HOSPITAL_COMMUNITY): Payer: Self-pay

## 2018-01-29 DIAGNOSIS — A539 Syphilis, unspecified: Secondary | ICD-10-CM | POA: Diagnosis present

## 2018-01-29 MED ORDER — PENICILLIN G BENZATHINE 1200000 UNIT/2ML IM SUSP
2.4000 10*6.[IU] | Freq: Once | INTRAMUSCULAR | Status: AC
  Administered 2018-01-29: 2.4 10*6.[IU] via INTRAMUSCULAR
  Filled 2018-01-29: qty 4

## 2018-01-29 MED ORDER — PENICILLIN G BENZATHINE 1200000 UNIT/2ML IM SUSP: 2.4000 10*6.[IU] | Freq: Once | INTRAMUSCULAR | Status: DC

## 2018-01-29 NOTE — [facility] Note (Signed)
Pt here for PCN injection. This is the 2 out of 3 doses.

## 2018-01-30 ENCOUNTER — Encounter: Payer: Self-pay | Admitting: Obstetrics & Gynecology

## 2018-01-30 NOTE — Patient Instructions (Signed)
1. Encounter for IUD removal Easy removal of Mirena IUD with strings grasped in the endocervical canal.  IUD intact and complete, shown to patient and discarded.  2. Intrauterine contraceptive device threads lost, subsequent encounter IUD removed.  3. Encounter for initial prescription of contraceptive pills Decision to start on Loestrin 24 FE 1/20 after counseling.  No contraindication to birth control pills.  Usage, risks and benefits reviewed with patient.  Prescription sent to pharmacy.  Other orders - Norethindrone Acetate-Ethinyl Estrad-FE (LOESTRIN 24 FE) 1-20 MG-MCG(24) tablet; Take 1 tablet by mouth daily.  Misty Russell, it was a pleasure seeing you today!

## 2018-02-05 ENCOUNTER — Inpatient Hospital Stay (HOSPITAL_COMMUNITY)
Admission: AD | Admit: 2018-02-05 | Discharge: 2018-02-05 | Disposition: A | Discharge status: Home / Self Care | Payer: BC Managed Care – PPO | Source: Ambulatory Visit | Attending: Obstetrics & Gynecology | Admitting: Obstetrics & Gynecology

## 2018-02-05 DIAGNOSIS — A539 Syphilis, unspecified: Secondary | ICD-10-CM | POA: Insufficient documentation

## 2018-02-05 MED ORDER — PENICILLIN G BENZATHINE 1200000 UNIT/2ML IM SUSP
2.4000 10*6.[IU] | Freq: Once | INTRAMUSCULAR | Status: AC
  Administered 2018-02-05: 2.4 10*6.[IU] via INTRAMUSCULAR
  Filled 2018-02-05: qty 4

## 2018-02-05 NOTE — [facility] Note (Signed)
PT IS HERE FOR 3 RD INJECTION  OF MEDICATION.   DENIES ANY PROBLEMS FROM OTHER 2 .

## 2018-02-12 ENCOUNTER — Ambulatory Visit: Payer: BC Managed Care – PPO | Admitting: Obstetrics & Gynecology

## 2018-02-12 ENCOUNTER — Other Ambulatory Visit: Payer: BC Managed Care – PPO

## 2018-02-20 ENCOUNTER — Ambulatory Visit: Payer: BC Managed Care – PPO | Admitting: Obstetrics & Gynecology

## 2019-01-21 ENCOUNTER — Other Ambulatory Visit: Payer: Self-pay | Admitting: Obstetrics & Gynecology

## 2019-05-13 ENCOUNTER — Other Ambulatory Visit: Payer: Self-pay

## 2019-05-13 DIAGNOSIS — Z20822 Contact with and (suspected) exposure to covid-19: Secondary | ICD-10-CM

## 2019-05-15 LAB — NOVEL CORONAVIRUS, NAA: SARS-CoV-2, NAA: NOT DETECTED

## 2019-08-13 ENCOUNTER — Ambulatory Visit: Payer: BC Managed Care – PPO | Attending: Internal Medicine

## 2019-08-13 DIAGNOSIS — Z23 Encounter for immunization: Secondary | ICD-10-CM | POA: Insufficient documentation

## 2019-08-13 NOTE — Progress Notes (Signed)
   Covid-19 Vaccination Clinic  Name:  Misty Russell    MRN: [id number] DOB: Aug 26, 1980  08/13/2019  [name] was observed post Covid-19 immunization for 15 minutes without incidence. She was provided with Vaccine Information Sheet and instruction to access the V-Safe system.   [name] was instructed to call 911 with any severe reactions post vaccine: Marland Kitchen Difficulty breathing  . Swelling of your face and throat  . A fast heartbeat  . A bad rash all over your body  . Dizziness and weakness    Immunizations Administered    Name Date Dose VIS Date Route   Pfizer COVID-19 Vaccine 08/13/2019  4:02 PM 0.3 mL 06/04/2019 Intramuscular   Manufacturer: [employer], [employer]   Lot: [id number]   NDC: [address]-2

## 2019-09-07 ENCOUNTER — Ambulatory Visit: Payer: BC Managed Care – PPO | Attending: Internal Medicine

## 2019-09-07 DIAGNOSIS — Z23 Encounter for immunization: Secondary | ICD-10-CM

## 2019-09-07 NOTE — Progress Notes (Signed)
   Covid-19 Vaccination Clinic  Name:  Misty Russell    MRN: [id number] DOB: 10/24/1980  09/07/2019  Misty Russell was observed post Covid-19 immunization for 15 minutes without incident. She was provided with Vaccine Information Sheet and instruction to access the V-Safe system.   Misty Russell was instructed to call 911 with any severe reactions post vaccine: Marland Kitchen Difficulty breathing  . Swelling of face and throat  . A fast heartbeat  . A bad rash all over body  . Dizziness and weakness   Immunizations Administered    Name Date Dose VIS Date Route   Pfizer COVID-19 Vaccine 09/07/2019  2:41 PM 0.3 mL 06/04/2019 Intramuscular   Manufacturer: [employer], [employer]   Lot: [id number]   NDC: [address]-1

## 2019-10-04 ENCOUNTER — Ambulatory Visit: Payer: Self-pay

## 2019-10-04 NOTE — Telephone Encounter (Signed)
Patient has an appointment with Dr Creta Levin on 10/06/2019. Appointment scheduled by Eleonore Chiquito.

## 2019-10-04 NOTE — Telephone Encounter (Signed)
Pt. Reports she has right leg pain on and off " for a few months now." Right leg, calf area. No redness or swelling. No injury. Pian is 2/10. States her OB/GYN told her she has a clotting "disorder." Has had 2 miscarriages recently. Concerned she could have a clot. No availability in the practice today or tomorrow per Maralyn Sago. Instructed pt. To go to ED for worsening symptoms. Warm transfer to Maralyn Sago in the practice.   Answer Assessment - Initial Assessment Questions 1. ONSET: "When did the pain start?"      Started a few months ago 2. LOCATION: "Where is the pain located?"      Right leg, between knee and ankle - calf 3. PAIN: "How bad is the pain?"    (Scale 1-10; or mild, moderate, severe)   -  MILD (1-3): doesn't interfere with normal activities    -  MODERATE (4-7): interferes with normal activities (e.g., work or school) or awakens from sleep, limping    -  SEVERE (8-10): excruciating pain, unable to do any normal activities, unable to walk     2-3 4. WORK OR EXERCISE: "Has there been any recent work or exercise that involved this part of the body?"      No 5. CAUSE: "What do you think is causing the leg pain?"     No 6. OTHER SYMPTOMS: "Do you have any other symptoms?" (e.g., chest pain, back pain, breathing difficulty, swelling, rash, fever, numbness, weakness)     No 7. PREGNANCY: "Is there any chance you are pregnant?" "When was your last menstrual period?"     No  Protocols used: LEG PAIN-A-AH

## 2019-10-05 ENCOUNTER — Telehealth: Payer: Self-pay | Admitting: Nurse Practitioner

## 2019-10-05 NOTE — Telephone Encounter (Signed)
Received a new hem referral from Dr. Juliene Pina at [facility] for abnormal beta-2glycoprotein I AB, IGG. Mrs. Misty Russell has been cld and scheduled to see [name] on 4/21 at 945am. Pt aware to arrive 15 minutes early.

## 2019-10-06 ENCOUNTER — Ambulatory Visit: Payer: BC Managed Care – PPO | Admitting: Family Medicine

## 2019-10-06 ENCOUNTER — Ambulatory Visit (INDEPENDENT_AMBULATORY_CARE_PROVIDER_SITE_OTHER): Payer: BC Managed Care – PPO

## 2019-10-06 ENCOUNTER — Other Ambulatory Visit: Payer: Self-pay

## 2019-10-06 ENCOUNTER — Encounter: Payer: Self-pay | Admitting: Family Medicine

## 2019-10-06 VITALS — BP 125/82 | HR 68 | Temp 97.9°F | Resp 16 | Ht 63.5 in | Wt 138.0 lb

## 2019-10-06 DIAGNOSIS — N96 Recurrent pregnancy loss: Secondary | ICD-10-CM

## 2019-10-06 DIAGNOSIS — M25561 Pain in right knee: Secondary | ICD-10-CM

## 2019-10-06 DIAGNOSIS — M6289 Other specified disorders of muscle: Secondary | ICD-10-CM
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
## 2019-10-06 NOTE — Patient Instructions (Addendum)
   If you have lab work done today you will be contacted with your lab results within the next 2 weeks.  If you have not heard from us then please contact us. The fastest way to get your results is to register for My Chart.   IF you received an x-ray today, you will receive an invoice from [facility]. Please contact [facility] at [phone] with questions or concerns regarding your invoice.   IF you received labwork today, you will receive an invoice from [facility]. Please contact [facility] at [phone] with questions or concerns regarding your invoice.   Our billing staff will not be able to assist you with questions regarding bills from these companies.  You will be contacted with the lab results as soon as they are available. The fastest way to get your results is to activate your My Chart account. Instructions are located on the last page of this paperwork. If you have not heard from us regarding the results in 2 weeks, please contact this office.      Acute Knee Pain, Adult Acute knee pain is sudden and may be caused by damage, swelling, or irritation of the muscles and tissues that support your knee. The injury may result from:  A fall.  An injury to your knee from twisting motions.  A hit to the knee.  Infection. Acute knee pain may go away on its own with time and rest. If it does not, your health care provider may order tests to find the cause of the pain. These may include:  Imaging tests, such as an X-ray, MRI, or ultrasound.  Joint aspiration. In this test, fluid is removed from the knee.  Arthroscopy. In this test, a lighted tube is inserted into the knee and an image is projected onto a TV screen.  Biopsy. In this test, a sample of tissue is removed from the body and studied under a microscope. Follow these instructions at home: Pay attention to any changes in your symptoms. Take these actions to relieve your pain. If you have a knee sleeve  or brace:   Wear the sleeve or brace as told by your health care provider. Remove it only as told by your health care provider.  Loosen the sleeve or brace if your toes tingle, become numb, or turn cold and blue.  Keep the sleeve or brace clean.  If the sleeve or brace is not waterproof: ? Do not let it get wet. ? Cover it with a watertight covering when you take a bath or shower. Activity  Rest your knee.  Do not do things that cause pain or make pain worse.  Avoid high-impact activities or exercises, such as running, jumping rope, or doing jumping jacks.  Work with a physical therapist to make a safe exercise program, as recommended by your health care provider. Do exercises as told by your physical therapist. Managing pain, stiffness, and swelling   If directed, put ice on the knee: ? Put ice in a plastic bag. ? Place a towel between your skin and the bag. ? Leave the ice on for 20 minutes, 2-3 times a day.  If directed, use an elastic bandage to put pressure (compression) on your injured knee. This may control swelling, give support, and help with discomfort. General instructions  Take over-the-counter and prescription medicines only as told by your health care provider.  Raise (elevate) your knee above the level of your heart when you are sitting or lying down.    Sleep with a pillow under your knee.  Do not use any products that contain nicotine or tobacco, such as cigarettes, e-cigarettes, and chewing tobacco. These can delay healing. If you need help quitting, ask your health care provider.  If you are overweight, work with your health care provider and a dietitian to set a weight-loss goal that is healthy and reasonable for you. Extra weight can put pressure on your knee.  Keep all follow-up visits as told by your health care provider. This is important. Contact a health care provider if:  Your knee pain continues, changes, or gets worse.  You have a fever along  with knee pain.  Your knee feels warm to the touch.  Your knee buckles or locks up. Get help right away if:  Your knee swells, and the swelling becomes worse.  You cannot move your knee.  You have severe pain in your knee. Summary  Acute knee pain can be caused by a fall, an injury, an infection, or damage, swelling, or irritation of the tissues that support your knee.  Your health care provider may perform tests to find out the cause of the pain.  Pay attention to any changes in your symptoms. Relieve your pain with rest, medicines, light activity, and use of ice.  Get help if your pain continues or becomes worse, your knee swells, or you cannot move your knee. This information is not intended to replace advice given to you by your health care provider. Make sure you discuss any questions you have with your health care provider. Document Revised: 11/20/2017 Document Reviewed: 11/20/2017 Elsevier Patient Education  2020 [employer]  

## 2019-10-07 ENCOUNTER — Telehealth: Payer: Self-pay | Admitting: Family Medicine

## 2019-10-07 DIAGNOSIS — M79604 Pain in right leg: Secondary | ICD-10-CM

## 2019-10-07 DIAGNOSIS — D6859 Other primary thrombophilia: Secondary | ICD-10-CM

## 2019-10-07 DIAGNOSIS — M25561 Pain in right knee: Secondary | ICD-10-CM

## 2019-10-07 NOTE — Telephone Encounter (Signed)
Patient was seen yesterday . Referral discussed . Patient is asking if Dr. Creta Levin would be willing  to put in a referral as discussed for a ultra sound  Patient is asking just for peace of mind .  Please advise

## 2019-10-12 ENCOUNTER — Emergency Department (HOSPITAL_BASED_OUTPATIENT_CLINIC_OR_DEPARTMENT_OTHER): Payer: BC Managed Care – PPO

## 2019-10-12 ENCOUNTER — Emergency Department (HOSPITAL_BASED_OUTPATIENT_CLINIC_OR_DEPARTMENT_OTHER)
Admission: EM | Admit: 2019-10-12 | Discharge: 2019-10-12 | Disposition: A | Discharge status: Home / Self Care | Payer: BC Managed Care – PPO | Attending: Emergency Medicine | Admitting: Emergency Medicine

## 2019-10-12 ENCOUNTER — Other Ambulatory Visit: Payer: Self-pay

## 2019-10-12 ENCOUNTER — Ambulatory Visit (INDEPENDENT_AMBULATORY_CARE_PROVIDER_SITE_OTHER)
Admission: EM | Admit: 2019-10-12 | Discharge: 2019-10-12 | Disposition: A | Discharge status: Home / Self Care | Payer: BC Managed Care – PPO | Source: Home / Self Care

## 2019-10-12 ENCOUNTER — Encounter: Payer: Self-pay | Admitting: Emergency Medicine

## 2019-10-12 ENCOUNTER — Encounter (HOSPITAL_BASED_OUTPATIENT_CLINIC_OR_DEPARTMENT_OTHER): Payer: Self-pay

## 2019-10-12 DIAGNOSIS — R0789 Other chest pain: Secondary | ICD-10-CM | POA: Diagnosis not present

## 2019-10-12 DIAGNOSIS — D689 Coagulation defect, unspecified: Secondary | ICD-10-CM

## 2019-10-12 DIAGNOSIS — Z888 Allergy status to other drugs, medicaments and biological substances status: Secondary | ICD-10-CM | POA: Diagnosis not present

## 2019-10-12 DIAGNOSIS — M25512 Pain in left shoulder: Secondary | ICD-10-CM | POA: Insufficient documentation

## 2019-10-12 DIAGNOSIS — M542 Cervicalgia: Secondary | ICD-10-CM | POA: Insufficient documentation

## 2019-10-12 HISTORY — DX: Anxiety disorder, unspecified: F41.9

## 2019-10-12 LAB — COMPREHENSIVE METABOLIC PANEL
ALT: 17 U/L (ref 0–44)
AST: 18 U/L (ref 15–41)
Albumin: 4.4 g/dL (ref 3.5–5.0)
Alkaline Phosphatase: 70 U/L (ref 38–126)
Anion gap: 9 (ref 5–15)
BUN: 9 mg/dL (ref 6–20)
CO2: 25 mmol/L (ref 22–32)
Calcium: 9.2 mg/dL (ref 8.9–10.3)
Chloride: 104 mmol/L (ref 98–111)
Creatinine, Ser: 0.81 mg/dL (ref 0.44–1.00)
GFR calc Af Amer: >60 mL/min (ref >60)
GFR calc non Af Amer: >60 mL/min (ref >60)
Glucose, Bld: 92 mg/dL (ref 70–99)
Potassium: 3.6 mmol/L (ref 3.5–5.1)
Sodium: 138 mmol/L (ref 135–145)
Total Bilirubin: 1.1 mg/dL (ref 0.3–1.2)
Total Protein: 8.5 g/dL — ABNORMAL HIGH (ref 6.5–8.1)

## 2019-10-12 LAB — PREGNANCY, URINE: Preg Test, Ur: NEGATIVE

## 2019-10-12 LAB — CBC WITH DIFFERENTIAL/PLATELET
Abs Immature Granulocytes: 0.01 10*3/uL (ref 0.00–0.07)
Basophils Absolute: 0 10*3/uL (ref 0.0–0.1)
Basophils Relative: 0 %
Eosinophils Absolute: 0.1 10*3/uL (ref 0.0–0.5)
Eosinophils Relative: 1 %
HCT: 39.7 % (ref 36.0–46.0)
Hemoglobin: 13 g/dL (ref 12.0–15.0)
Immature Granulocytes: 0 %
Lymphocytes Relative: 39 %
Lymphs Abs: 2.2 10*3/uL (ref 0.7–4.0)
MCH: 30.1 pg (ref 26.0–34.0)
MCHC: 32.7 g/dL (ref 30.0–36.0)
MCV: 91.9 fL (ref 80.0–100.0)
Monocytes Absolute: 0.3 10*3/uL (ref 0.1–1.0)
Monocytes Relative: 6 %
Neutro Abs: 3 10*3/uL (ref 1.7–7.7)
Neutrophils Relative %: 54 %
Platelets: 275 10*3/uL (ref 150–400)
RBC: 4.32 MIL/uL (ref 3.87–5.11)
RDW: 13.3 % (ref 11.5–15.5)
WBC: 5.6 10*3/uL (ref 4.0–10.5)
nRBC: 0 % (ref 0.0–0.2)

## 2019-10-12 LAB — D-DIMER, QUANTITATIVE: D-Dimer, Quant: 0.5 ug/mL-FEU (ref 0.00–0.50)

## 2019-10-12 LAB — TROPONIN I (HIGH SENSITIVITY): Troponin I (High Sensitivity): <2 ng/L (ref <18)

## 2019-10-12 NOTE — ED Notes (Signed)
ED Provider at bedside. 
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
## 2019-10-12 NOTE — Discharge Instructions (Signed)
Take Tylenol ibuprofen as needed for pain. Follow-up with the hematologist Dr. Schedule appointment tomorrow. Return to the emergency room if you develop severe worsening chest pain, difficulty breathing, or any new, worsening, or concerning symptoms.

## 2019-10-12 NOTE — ED Triage Notes (Addendum)
left arm aching since last night, left neck and left shoulder aching since last night.  Today arm continues to ache, but now has center chest pain.  Chest pain in left chest is an ache, constant.  Denies nausea, no vomiting.  No sob.  Patient does have anxiety  Patient has had both covid vaccines

## 2019-10-12 NOTE — ED Triage Notes (Signed)
Pt arrives with c/o CP with radiation to back, left neck, and left arm since yesterday. States that she does have a upcoming appointment with hematology for clotting disorder.
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
## 2019-10-13 ENCOUNTER — Other Ambulatory Visit: Payer: Self-pay

## 2019-10-13 ENCOUNTER — Inpatient Hospital Stay: Payer: BC Managed Care – PPO | Attending: Nurse Practitioner | Admitting: Nurse Practitioner

## 2019-10-13 ENCOUNTER — Telehealth: Payer: Self-pay

## 2019-10-13 ENCOUNTER — Encounter: Payer: Self-pay | Admitting: Nurse Practitioner

## 2019-10-13 VITALS — BP 131/74 | HR 66 | Temp 98.2°F | Resp 16 | Ht 63.0 in | Wt 138.5 lb

## 2019-10-13 DIAGNOSIS — Z79899 Other long term (current) drug therapy: Secondary | ICD-10-CM | POA: Diagnosis not present

## 2019-10-13 DIAGNOSIS — R0789 Other chest pain: Secondary | ICD-10-CM | POA: Diagnosis not present

## 2019-10-13 DIAGNOSIS — N96 Recurrent pregnancy loss: Secondary | ICD-10-CM | POA: Insufficient documentation

## 2019-10-13 DIAGNOSIS — F419 Anxiety disorder, unspecified: Secondary | ICD-10-CM | POA: Insufficient documentation

## 2019-10-13 DIAGNOSIS — M79604 Pain in right leg: Secondary | ICD-10-CM | POA: Diagnosis not present

## 2019-10-13 NOTE — Telephone Encounter (Signed)
TC per Santiago Glad NP to vascular [phone]) to schedule patient for doppler. Doppler scheduled for tomorrow @2  at [facility]. Patient made aware of appointment.

## 2019-10-14 ENCOUNTER — Telehealth: Payer: Self-pay | Admitting: Nurse Practitioner

## 2019-10-14 ENCOUNTER — Ambulatory Visit (HOSPITAL_COMMUNITY): Payer: BC Managed Care – PPO

## 2019-10-14 NOTE — Telephone Encounter (Signed)
Scheduled appt per 4/21 los.  Spoke with pt and she is aware of her scheduled appt date and time,

## 2019-10-14 NOTE — Progress Notes (Signed)
Copy of 10/13/2019 ov note faxed to Dr. Juliene Pina at [phone]

## 2019-10-19 ENCOUNTER — Other Ambulatory Visit: Payer: BC Managed Care – PPO

## 2019-10-19 ENCOUNTER — Encounter: Payer: BC Managed Care – PPO | Admitting: Nurse Practitioner

## 2019-11-02 ENCOUNTER — Telehealth: Payer: BC Managed Care – PPO | Admitting: Internal Medicine

## 2019-11-17 ENCOUNTER — Other Ambulatory Visit: Payer: Self-pay

## 2019-11-18 ENCOUNTER — Ambulatory Visit: Payer: BC Managed Care – PPO | Admitting: Family Medicine

## 2019-11-19 ENCOUNTER — Encounter: Payer: Self-pay | Admitting: Family Medicine

## 2019-11-19 ENCOUNTER — Ambulatory Visit: Payer: BC Managed Care – PPO | Admitting: Family Medicine

## 2019-11-19 VITALS — BP 100/78 | HR 71 | Temp 97.6°F | Ht 64.0 in | Wt 138.6 lb

## 2019-11-19 DIAGNOSIS — Z0000 Encounter for general adult medical examination without abnormal findings: Secondary | ICD-10-CM

## 2019-11-19 DIAGNOSIS — F419 Anxiety disorder, unspecified: Secondary | ICD-10-CM | POA: Diagnosis not present

## 2019-11-19 LAB — LIPID PANEL
Cholesterol: 178 mg/dL (ref 0–200)
HDL: 44.2 mg/dL (ref >39.00)
LDL Cholesterol: 115 mg/dL — ABNORMAL HIGH (ref 0–99)
NonHDL: 133.45
Total CHOL/HDL Ratio: 4
Triglycerides: 92 mg/dL (ref 0.0–149.0)
VLDL: 18.4 mg/dL (ref 0.0–40.0)

## 2019-11-19 LAB — URINALYSIS, ROUTINE W REFLEX MICROSCOPIC
Bilirubin Urine: NEGATIVE
Hgb urine dipstick: NEGATIVE
Ketones, ur: NEGATIVE
Leukocytes,Ua: NEGATIVE
Nitrite: NEGATIVE
Specific Gravity, Urine: 1.02 (ref 1.000–1.030)
Total Protein, Urine: NEGATIVE
Urine Glucose: NEGATIVE
Urobilinogen, UA: 0.2 (ref 0.0–1.0)
pH: 8 (ref 5.0–8.0)

## 2019-11-19 LAB — COMPREHENSIVE METABOLIC PANEL
ALT: 35 U/L (ref 0–35)
AST: 24 U/L (ref 0–37)
Albumin: 4.5 g/dL (ref 3.5–5.2)
Alkaline Phosphatase: 89 U/L (ref 39–117)
BUN: 13 mg/dL (ref 6–23)
CO2: 30 mEq/L (ref 19–32)
Calcium: 9.7 mg/dL (ref 8.4–10.5)
Chloride: 103 mEq/L (ref 96–112)
Creatinine, Ser: 0.78 mg/dL (ref 0.40–1.20)
GFR: 100.16 mL/min (ref >60.00)
Glucose, Bld: 82 mg/dL (ref 70–99)
Potassium: 5.1 mEq/L (ref 3.5–5.1)
Sodium: 139 mEq/L (ref 135–145)
Total Bilirubin: 0.9 mg/dL (ref 0.2–1.2)
Total Protein: 8 g/dL (ref 6.0–8.3)

## 2019-11-19 LAB — LDL CHOLESTEROL, DIRECT: Direct LDL: 107 mg/dL

## 2019-11-19 LAB — CBC
HCT: 40.7 % (ref 36.0–46.0)
Hemoglobin: 13.4 g/dL (ref 12.0–15.0)
MCHC: 32.9 g/dL (ref 30.0–36.0)
MCV: 89.7 fl (ref 78.0–100.0)
Platelets: 312 10*3/uL (ref 150.0–400.0)
RBC: 4.54 Mil/uL (ref 3.87–5.11)
RDW: 13.5 % (ref 11.5–15.5)
WBC: 6 10*3/uL (ref 4.0–10.5)

## 2019-11-19 LAB — TSH: TSH: 1.55 u[IU]/mL (ref 0.35–4.50)

## 2019-11-19 MED ORDER — FLUOXETINE HCL 10 MG PO CAPS: 10.0000 mg | ORAL_CAPSULE | Freq: Every day | ORAL | 1 refills | Status: DC

## 2019-11-19 NOTE — Patient Instructions (Addendum)
There are no preventive care reminders to display for this patient.  Depression screen [facility] 2/9 10/06/2019 12/14/2016 05/13/2015  Decreased Interest 1 0 0  Down, Depressed, Hopeless 1 0 0  PHQ - 2 Score 2 0 0  Altered sleeping 1 - -  Tired, decreased energy 0 - -  Change in appetite 0 - -  Feeling bad or failure about yourself  0 - -  Trouble concentrating 0 - -  Moving slowly or fidgety/restless 0 - -  Suicidal thoughts 0 - -  PHQ-9 Score 3 - -  Difficult doing work/chores Not difficult at all - -    Health Maintenance, Female Adopting a healthy lifestyle and getting preventive care are important in promoting health and wellness. Ask your health care provider about:  The right schedule for you to have regular tests and exams.  Things you can do on your own to prevent diseases and keep yourself healthy. What should I know about diet, weight, and exercise? Eat a healthy diet   Eat a diet that includes plenty of vegetables, fruits, low-fat dairy products, and lean protein.  Do not eat a lot of foods that are high in solid fats, added sugars, or sodium. Maintain a healthy weight Body mass index (BMI) is used to identify weight problems. It estimates body fat based on height and weight. Your health care provider can help determine your BMI and help you achieve or maintain a healthy weight. Get regular exercise Get regular exercise. This is one of the most important things you can do for your health. Most adults should:  Exercise for at least 150 minutes each week. The exercise should increase your heart rate and make you sweat (moderate-intensity exercise).  Do strengthening exercises at least twice a week. This is in addition to the moderate-intensity exercise.  Spend less time sitting. Even light physical activity can be beneficial. Watch cholesterol and blood lipids Have your blood tested for lipids and cholesterol at 38 years of age, then have this test every 5 years. Have your  cholesterol levels checked more often if:  Your lipid or cholesterol levels are high.  You are older than 38 years of age.  You are at high risk for heart disease. What should I know about cancer screening? Depending on your health history and family history, you may need to have cancer screening at various ages. This may include screening for:  Breast cancer.  Cervical cancer.  Colorectal cancer.  Skin cancer.  Lung cancer. What should I know about heart disease, diabetes, and high blood pressure? Blood pressure and heart disease  High blood pressure causes heart disease and increases the risk of stroke. This is more likely to develop in people who have high blood pressure readings, are of African descent, or are overweight.  Have your blood pressure checked: ? Every 3-5 years if you are 67-41 years of age. ? Every year if you are 39 years old or older. Diabetes Have regular diabetes screenings. This checks your fasting blood sugar level. Have the screening done:  Once every three years after age 88 if you are at a normal weight and have a low risk for diabetes.  More often and at a younger age if you are overweight or have a high risk for diabetes. What should I know about preventing infection? Hepatitis B If you have a higher risk for hepatitis B, you should be screened for this virus. Talk with your health care provider to find out if  you are at risk for hepatitis B infection. Hepatitis C Testing is recommended for:  Everyone born from [birth year] through 1964.  Anyone with known risk factors for hepatitis C. Sexually transmitted infections (STIs)  Get screened for STIs, including gonorrhea and chlamydia, if: ? You are sexually active and are younger than 38 years of age. ? You are older than 38 years of age and your health care provider tells you that you are at risk for this type of infection. ? Your sexual activity has changed since you were last screened, and you are  at increased risk for chlamydia or gonorrhea. Ask your health care provider if you are at risk.  Ask your health care provider about whether you are at high risk for HIV. Your health care provider may recommend a prescription medicine to help prevent HIV infection. If you choose to take medicine to prevent HIV, you should first get tested for HIV. You should then be tested every 3 months for as long as you are taking the medicine. Pregnancy  If you are about to stop having your period (premenopausal) and you may become pregnant, seek counseling before you get pregnant.  Take 400 to 800 micrograms (mcg) of folic acid every day if you become pregnant.  Ask for birth control (contraception) if you want to prevent pregnancy. Osteoporosis and menopause Osteoporosis is a disease in which the bones lose minerals and strength with aging. This can result in bone fractures. If you are 83 years old or older, or if you are at risk for osteoporosis and fractures, ask your health care provider if you should:  Be screened for bone loss.  Take a calcium or vitamin D supplement to lower your risk of fractures.  Be given hormone replacement therapy (HRT) to treat symptoms of menopause. Follow these instructions at home: Lifestyle  Do not use any products that contain nicotine or tobacco, such as cigarettes, e-cigarettes, and chewing tobacco. If you need help quitting, ask your health care provider.  Do not use street drugs.  Do not share needles.  Ask your health care provider for help if you need support or information about quitting drugs. Alcohol use  Do not drink alcohol if: ? Your health care provider tells you not to drink. ? You are pregnant, may be pregnant, or are planning to become pregnant.  If you drink alcohol: ? Limit how much you use to 0-1 drink a day. ? Limit intake if you are breastfeeding.  Be aware of how much alcohol is in your drink. In the [place] one drink equals one 12 oz  bottle of beer (355 mL), one 5 oz glass of wine (148 mL), or one 1 oz glass of hard liquor (44 mL). General instructions  Schedule regular health, dental, and eye exams.  Stay current with your vaccines.  Tell your health care provider if: ? You often feel depressed. ? You have ever been abused or do not feel safe at home. Summary  Adopting a healthy lifestyle and getting preventive care are important in promoting health and wellness.  Follow your health care provider's instructions about healthy diet, exercising, and getting tested or screened for diseases.  Follow your health care provider's instructions on monitoring your cholesterol and blood pressure. This information is not intended to replace advice given to you by your health care provider. Make sure you discuss any questions you have with your health care provider. Document Revised: 06/03/2018 Document Reviewed: 06/03/2018 Elsevier Patient Education  2020 Elsevier  Inc.  Preventive Care 36-74 Years Old, Female Preventive care refers to visits with your health care provider and lifestyle choices that can promote health and wellness. This includes:  A yearly physical exam. This may also be called an annual well check.  Regular dental visits and eye exams.  Immunizations.  Screening for certain conditions.  Healthy lifestyle choices, such as eating a healthy diet, getting regular exercise, not using drugs or products that contain nicotine and tobacco, and limiting alcohol use. What can I expect for my preventive care visit? Physical exam Your health care provider will check your:  Height and weight. This may be used to calculate body mass index (BMI), which tells if you are at a healthy weight.  Heart rate and blood pressure.  Skin for abnormal spots. Counseling Your health care provider may ask you questions about your:  Alcohol, tobacco, and drug use.  Emotional well-being.  Home and relationship  well-being.  Sexual activity.  Eating habits.  Work and work Statistician.  Method of birth control.  Menstrual cycle.  Pregnancy history. What immunizations do I need?  Influenza (flu) vaccine  This is recommended every year. Tetanus, diphtheria, and pertussis (Tdap) vaccine  You may need a Td booster every 10 years. Varicella (chickenpox) vaccine  You may need this if you have not been vaccinated. Human papillomavirus (HPV) vaccine  If recommended by your health care provider, you may need three doses over 6 months. Measles, mumps, and rubella (MMR) vaccine  You may need at least one dose of MMR. You may also need a second dose. Meningococcal conjugate (MenACWY) vaccine  One dose is recommended if you are age 44-21 years and a first-year college student living in a residence hall, or if you have one of several medical conditions. You may also need additional booster doses. Pneumococcal conjugate (PCV13) vaccine  You may need this if you have certain conditions and were not previously vaccinated. Pneumococcal polysaccharide (PPSV23) vaccine  You may need one or two doses if you smoke cigarettes or if you have certain conditions. Hepatitis A vaccine  You may need this if you have certain conditions or if you travel or work in places where you may be exposed to hepatitis A. Hepatitis B vaccine  You may need this if you have certain conditions or if you travel or work in places where you may be exposed to hepatitis B. Haemophilus influenzae type b (Hib) vaccine  You may need this if you have certain conditions. You may receive vaccines as individual doses or as more than one vaccine together in one shot (combination vaccines). Talk with your health care provider about the risks and benefits of combination vaccines. What tests do I need?  Blood tests  Lipid and cholesterol levels. These may be checked every 5 years starting at age 4.  Hepatitis C test.  Hepatitis  B test. Screening  Diabetes screening. This is done by checking your blood sugar (glucose) after you have not eaten for a while (fasting).  Sexually transmitted disease (STD) testing.  BRCA-related cancer screening. This may be done if you have a family history of breast, ovarian, tubal, or peritoneal cancers.  Pelvic exam and Pap test. This may be done every 3 years starting at age 1. Starting at age 45, this may be done every 5 years if you have a Pap test in combination with an HPV test. Talk with your health care provider about your test results, treatment options, and if necessary, the need  for more tests. Follow these instructions at home: Eating and drinking   Eat a diet that includes fresh fruits and vegetables, whole grains, lean protein, and low-fat dairy.  Take vitamin and mineral supplements as recommended by your health care provider.  Do not drink alcohol if: ? Your health care provider tells you not to drink. ? You are pregnant, may be pregnant, or are planning to become pregnant.  If you drink alcohol: ? Limit how much you have to 0-1 drink a day. ? Be aware of how much alcohol is in your drink. In the [place] one drink equals one 12 oz bottle of beer (355 mL), one 5 oz glass of wine (148 mL), or one 1 oz glass of hard liquor (44 mL). Lifestyle  Take daily care of your teeth and gums.  Stay active. Exercise for at least 30 minutes on 5 or more days each week.  Do not use any products that contain nicotine or tobacco, such as cigarettes, e-cigarettes, and chewing tobacco. If you need help quitting, ask your health care provider.  If you are sexually active, practice safe sex. Use a condom or other form of birth control (contraception) in order to prevent pregnancy and STIs (sexually transmitted infections). If you plan to become pregnant, see your health care provider for a preconception visit. What's next?  Visit your health care provider once a year for a well  check visit.  Ask your health care provider how often you should have your eyes and teeth checked.  Stay up to date on all vaccines. This information is not intended to replace advice given to you by your health care provider. Make sure you discuss any questions you have with your health care provider. Document Revised: 02/19/2018 Document Reviewed: 02/19/2018 Elsevier Patient Education  [place], Adult After being diagnosed with an anxiety disorder, you may be relieved to know why you have felt or behaved a certain way. You may also feel overwhelmed about the treatment ahead and what it will mean for your life. With care and support, you can manage this condition and recover from it. How to manage lifestyle changes Managing stress and anxiety  Stress is your body's reaction to life changes and events, both good and bad. Most stress will last just a few hours, but stress can be ongoing and can lead to more than just stress. Although stress can play a major role in anxiety, it is not the same as anxiety. Stress is usually caused by something external, such as a deadline, test, or competition. Stress normally passes after the triggering event has ended.  Anxiety is caused by something internal, such as imagining a terrible outcome or worrying that something will go wrong that will devastate you. Anxiety often does not go away even after the triggering event is over, and it can become long-term (chronic) worry. It is important to understand the differences between stress and anxiety and to manage your stress effectively so that it does not lead to an anxious response. Talk with your health care provider or a counselor to learn more about reducing anxiety and stress. He or she may suggest tension reduction techniques, such as:  Music therapy. This can include creating or listening to music that you enjoy and that inspires you.  Mindfulness-based meditation. This involves  being aware of your normal breaths while not trying to control your breathing. It can be done while sitting or walking.  Centering prayer. This involves focusing  on a word, phrase, or sacred image that means something to you and brings you peace.  Deep breathing. To do this, expand your stomach and inhale slowly through your nose. Hold your breath for 3-5 seconds. Then exhale slowly, letting your stomach muscles relax.  Self-talk. This involves identifying thought patterns that lead to anxiety reactions and changing those patterns.  Muscle relaxation. This involves tensing muscles and then relaxing them. Choose a tension reduction technique that suits your lifestyle and personality. These techniques take time and practice. Set aside 5-15 minutes a day to do them. Therapists can offer counseling and training in these techniques. The training to help with anxiety may be covered by some insurance plans. Other things you can do to manage stress and anxiety include:  Keeping a stress/anxiety diary. This can help you learn what triggers your reaction and then learn ways to manage your response.  Thinking about how you react to certain situations. You may not be able to control everything, but you can control your response.  Making time for activities that help you relax and not feeling guilty about spending your time in this way.  Visual imagery and yoga can help you stay calm and relax.  Medicines Medicines can help ease symptoms. Medicines for anxiety include:  Anti-anxiety drugs.  Antidepressants. Medicines are often used as a primary treatment for anxiety disorder. Medicines will be prescribed by a health care provider. When used together, medicines, psychotherapy, and tension reduction techniques may be the most effective treatment. Relationships Relationships can play a big part in helping you recover. Try to spend more time connecting with trusted friends and family members. Consider  going to couples counseling, taking family education classes, or going to family therapy. Therapy can help you and others better understand your condition. How to recognize changes in your anxiety Everyone responds differently to treatment for anxiety. Recovery from anxiety happens when symptoms decrease and stop interfering with your daily activities at home or work. This may mean that you will start to:  Have better concentration and focus. Worry will interfere less in your daily thinking.  Sleep better.  Be less irritable.  Have more energy.  Have improved memory. It is important to recognize when your condition is getting worse. Contact your health care provider if your symptoms interfere with home or work and you feel like your condition is not improving. Follow these instructions at home: Activity  Exercise. Most adults should do the following: ? Exercise for at least 150 minutes each week. The exercise should increase your heart rate and make you sweat (moderate-intensity exercise). ? Strengthening exercises at least twice a week.  Get the right amount and quality of sleep. Most adults need 7-9 hours of sleep each night. Lifestyle   Eat a healthy diet that includes plenty of vegetables, fruits, whole grains, low-fat dairy products, and lean protein. Do not eat a lot of foods that are high in solid fats, added sugars, or salt.  Make choices that simplify your life.  Do not use any products that contain nicotine or tobacco, such as cigarettes, e-cigarettes, and chewing tobacco. If you need help quitting, ask your health care provider.  Avoid caffeine, alcohol, and certain over-the-counter cold medicines. These may make you feel worse. Ask your pharmacist which medicines to avoid. General instructions  Take over-the-counter and prescription medicines only as told by your health care provider.  Keep all follow-up visits as told by your health care provider. This is  important. Where to  find support You can get help and support from these sources:  Self-help groups.  Online and [employer].  A trusted spiritual leader.  Couples counseling.  Family education classes.  Family therapy. Where to find more information You may find that joining a support group helps you deal with your anxiety. The following sources can help you locate counselors or support groups near you:  [place]: www.mentalhealth[place].net  Anxiety and Depression Association of [place] (ADAA): https://www.[place]ark.net/  National Alliance on Mental Illness (NAMI): www.nami.org Contact a health care provider if you:  Have a hard time staying focused or finishing daily tasks.  Spend many hours a day feeling worried about everyday life.  Become exhausted by worry.  Start to have headaches, feel tense, or have nausea.  Urinate more than normal.  Have diarrhea. Get help right away if you have:  A racing heart and shortness of breath.  Thoughts of hurting yourself or others. If you ever feel like you may hurt yourself or others, or have thoughts about taking your own life, get help right away. You can go to your nearest emergency department or call:  Your local emergency services (911 in the [place]  A suicide crisis helpline, such as the [facility] at 504-265-3895. This is open 24 hours a day. Summary  Taking steps to learn and use tension reduction techniques can help calm you and help prevent triggering an anxiety reaction.  When used together, medicines, psychotherapy, and tension reduction techniques may be the most effective treatment.  Family, friends, and partners can play a big part in helping you recover from an anxiety disorder. This information is not intended to replace advice given to you by your health care provider. Make sure you discuss any questions you have with your health care provider. Document Revised:  11/10/2018 Document Reviewed: 11/10/2018 Elsevier Patient Education  [place].
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
## 2019-11-29 ENCOUNTER — Telehealth: Payer: Self-pay | Admitting: Family Medicine

## 2019-11-29 NOTE — Telephone Encounter (Signed)
Error

## 2019-12-17 ENCOUNTER — Telehealth: Payer: Self-pay | Admitting: Family Medicine

## 2019-12-17 NOTE — Telephone Encounter (Signed)
Patient would like call back regarding which titers were done in her recent lab work. Her new job is requesting: tdap, mmr 2 vaccines or positive titer, varicella positive titier, hep b positive titer. She would also like printout of all available immunization records.

## 2020-01-03 ENCOUNTER — Inpatient Hospital Stay: Payer: BC Managed Care – PPO | Attending: Nurse Practitioner

## 2020-01-03 ENCOUNTER — Other Ambulatory Visit: Payer: Self-pay

## 2020-01-03 DIAGNOSIS — R768 Other specified abnormal immunological findings in serum: Secondary | ICD-10-CM | POA: Insufficient documentation

## 2020-01-03 DIAGNOSIS — F419 Anxiety disorder, unspecified: Secondary | ICD-10-CM | POA: Insufficient documentation

## 2020-01-03 DIAGNOSIS — Z79899 Other long term (current) drug therapy: Secondary | ICD-10-CM | POA: Diagnosis not present

## 2020-01-03 DIAGNOSIS — Z7982 Long term (current) use of aspirin: Secondary | ICD-10-CM | POA: Insufficient documentation

## 2020-01-03 DIAGNOSIS — N96 Recurrent pregnancy loss: Secondary | ICD-10-CM | POA: Insufficient documentation

## 2020-01-05 LAB — BETA-2-GLYCOPROTEIN I ABS, IGG/M/A
Beta-2 Glyco I IgG: 83 GPI IgG units — ABNORMAL HIGH (ref 0–20)
Beta-2-Glycoprotein I IgA: <9 GPI IgA units (ref 0–25)
Beta-2-Glycoprotein I IgM: <9 GPI IgM units (ref 0–32)
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
## 2020-01-10 ENCOUNTER — Inpatient Hospital Stay (HOSPITAL_BASED_OUTPATIENT_CLINIC_OR_DEPARTMENT_OTHER): Payer: BC Managed Care – PPO | Admitting: Nurse Practitioner

## 2020-01-10 ENCOUNTER — Encounter: Payer: Self-pay | Admitting: Nurse Practitioner

## 2020-01-10 ENCOUNTER — Other Ambulatory Visit: Payer: Self-pay

## 2020-01-10 VITALS — BP 117/80 | HR 67 | Temp 97.2°F | Resp 18 | Ht 64.0 in | Wt 136.9 lb

## 2020-01-10 DIAGNOSIS — N96 Recurrent pregnancy loss: Secondary | ICD-10-CM

## 2020-01-10 DIAGNOSIS — R768 Other specified abnormal immunological findings in serum: Secondary | ICD-10-CM | POA: Diagnosis not present

## 2020-01-10 MED ORDER — PANTOPRAZOLE SODIUM 40 MG PO TBEC: 40.0000 mg | DELAYED_RELEASE_TABLET | Freq: Every day | ORAL | 0 refills | Status: DC

## 2020-01-11 ENCOUNTER — Telehealth: Payer: Self-pay | Admitting: Hematology

## 2020-01-11 NOTE — Telephone Encounter (Signed)
Scheduled per 7/19 los. Pt is aware of appt time and date.  

## 2020-01-19 ENCOUNTER — Telehealth: Payer: Self-pay | Admitting: Hematology

## 2020-01-19 NOTE — Telephone Encounter (Signed)
Rescheduled appointment per 7/27 provider message. Left message on patient's voicemail with updated appointment date and time.

## 2020-01-20 ENCOUNTER — Telehealth: Payer: Self-pay | Admitting: Family Medicine

## 2020-01-20 ENCOUNTER — Other Ambulatory Visit: Payer: Self-pay

## 2020-01-20 DIAGNOSIS — F419 Anxiety disorder, unspecified: Secondary | ICD-10-CM

## 2020-01-20 MED ORDER — FLUOXETINE HCL 10 MG PO CAPS: 10.0000 mg | ORAL_CAPSULE | Freq: Every day | ORAL | 1 refills | Status: DC

## 2020-01-20 NOTE — Telephone Encounter (Addendum)
Patient is calling and requesting a refill for Prozac sent to [facility] on [address].  Informed patient that Dr. Doreene Burke is out of the office and request would be sent to the provider of the day, please advise. CB is [phone]

## 2020-01-20 NOTE — Telephone Encounter (Signed)
Checked pt's chart and medication has been sent today.

## 2020-01-26 ENCOUNTER — Telehealth: Payer: BC Managed Care – PPO | Admitting: Family Medicine

## 2020-02-07 ENCOUNTER — Encounter: Payer: Self-pay | Admitting: Family Medicine

## 2020-02-07 ENCOUNTER — Telehealth (INDEPENDENT_AMBULATORY_CARE_PROVIDER_SITE_OTHER): Payer: BC Managed Care – PPO | Admitting: Family Medicine

## 2020-02-07 VITALS — Temp 98.5°F | Ht 64.0 in

## 2020-02-07 DIAGNOSIS — N3090 Cystitis, unspecified without hematuria: Secondary | ICD-10-CM

## 2020-02-07 DIAGNOSIS — R102 Pelvic and perineal pain: Secondary | ICD-10-CM | POA: Insufficient documentation

## 2020-02-07 DIAGNOSIS — F419 Anxiety disorder, unspecified: Secondary | ICD-10-CM | POA: Diagnosis not present

## 2020-02-07 MED ORDER — FLUOXETINE HCL 10 MG PO CAPS: 10.0000 mg | ORAL_CAPSULE | Freq: Every day | ORAL | 0 refills | Status: DC
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
## 2020-02-08 ENCOUNTER — Other Ambulatory Visit: Payer: Self-pay

## 2020-02-08 ENCOUNTER — Telehealth: Payer: Self-pay | Admitting: Family Medicine

## 2020-02-08 ENCOUNTER — Other Ambulatory Visit (INDEPENDENT_AMBULATORY_CARE_PROVIDER_SITE_OTHER): Payer: BC Managed Care – PPO

## 2020-02-08 DIAGNOSIS — R102 Pelvic and perineal pain: Secondary | ICD-10-CM | POA: Diagnosis not present

## 2020-02-08 LAB — URINALYSIS, ROUTINE W REFLEX MICROSCOPIC
Ketones, ur: NEGATIVE
Nitrite: POSITIVE — AB
Specific Gravity, Urine: 1.025 (ref 1.000–1.030)
Total Protein, Urine: 100 — AB
Urine Glucose: 100 — AB
Urobilinogen, UA: 2 — AB (ref 0.0–1.0)
pH: 6 (ref 5.0–8.0)

## 2020-02-08 NOTE — Telephone Encounter (Signed)
Patient is calling back regarding her lab results. Please give her a call back.

## 2020-02-08 NOTE — Telephone Encounter (Signed)
Patient would like a call back regarding results from her labs today. She states results were uploaded in Mychart and she has concerns about them.

## 2020-02-08 NOTE — Telephone Encounter (Signed)
Please advise results have not been viewed

## 2020-02-09 NOTE — Telephone Encounter (Signed)
Spoke with patient who verbally understood urine dip results and that she will receive a call once urine culture comes in .

## 2020-02-10 LAB — URINE CULTURE
MICRO NUMBER:: 10836609
SPECIMEN QUALITY:: ADEQUATE

## 2020-02-10 NOTE — Telephone Encounter (Signed)
Patient called back to check for results. I let her know that someone will call her when results are back.

## 2020-02-10 NOTE — Telephone Encounter (Signed)
Sent patient message via MYchart regarding PRELIMINARY lab results.

## 2020-02-11 DIAGNOSIS — N3090 Cystitis, unspecified without hematuria: Secondary | ICD-10-CM

## 2020-02-11 HISTORY — DX: Cystitis, unspecified without hematuria: N30.90

## 2020-02-11 MED ORDER — SULFAMETHOXAZOLE-TRIMETHOPRIM 800-160 MG PO TABS: 1.0000 | ORAL_TABLET | Freq: Two times a day (BID) | ORAL | 0 refills | Status: AC

## 2020-02-11 NOTE — Addendum Note (Signed)
Addended by: Andrez Grime on: 02/11/2020 07:33 AM   Modules accepted: Orders, Level of Service

## 2020-02-29 ENCOUNTER — Telehealth: Payer: Self-pay | Admitting: Family Medicine

## 2020-02-29 NOTE — Telephone Encounter (Signed)
Patient is calling and wanted to speak to someone regarding Prozac, please advise. CB is [phone]

## 2020-02-29 NOTE — Telephone Encounter (Signed)
Called pt but was put on hold and then she hung up, will call pt back.

## 2020-03-01 NOTE — Telephone Encounter (Signed)
Spoke with patient who states that she have had some symptoms where she feels like her head feels foggy and she's becoming forgetful. She have read on Prozac see the side effects and feels like she is experiencing some of them. Would like to know if this medication could changed to something else. Please advise.

## 2020-03-02 NOTE — Telephone Encounter (Signed)
Message sent to patient to schedule a follow up appointment.

## 2020-03-02 NOTE — Telephone Encounter (Signed)
The last time that I saw her she said that she was feeling better taking the drug. She is on a low dose. It would be unusual to suddenly develop this side effect. Please ask her to rtc and we can discuss using another medication.

## 2020-03-16 ENCOUNTER — Other Ambulatory Visit (HOSPITAL_COMMUNITY): Payer: Self-pay | Admitting: Obstetrics & Gynecology

## 2020-03-16 DIAGNOSIS — Z6824 Body mass index (BMI) 24.0-24.9, adult: Secondary | ICD-10-CM | POA: Diagnosis not present

## 2020-03-16 DIAGNOSIS — Z1151 Encounter for screening for human papillomavirus (HPV): Secondary | ICD-10-CM | POA: Diagnosis not present

## 2020-03-16 DIAGNOSIS — Z01419 Encounter for gynecological examination (general) (routine) without abnormal findings: Secondary | ICD-10-CM | POA: Diagnosis not present

## 2020-03-16 DIAGNOSIS — A6000 Herpesviral infection of urogenital system, unspecified: Secondary | ICD-10-CM | POA: Diagnosis not present

## 2020-03-16 DIAGNOSIS — F419 Anxiety disorder, unspecified: Secondary | ICD-10-CM | POA: Diagnosis not present

## 2020-04-14 MED FILL — VALACYCLOVIR HCL 500 MG TAB: 500 | 18 days supply | Qty: 35 | Fill #0

## 2020-04-21 DIAGNOSIS — F419 Anxiety disorder, unspecified: Secondary | ICD-10-CM | POA: Diagnosis not present

## 2020-05-01 ENCOUNTER — Ambulatory Visit: Payer: BC Managed Care – PPO | Admitting: Registered Nurse

## 2020-05-04 ENCOUNTER — Encounter: Payer: Self-pay | Admitting: Family Medicine

## 2020-05-04 ENCOUNTER — Ambulatory Visit (INDEPENDENT_AMBULATORY_CARE_PROVIDER_SITE_OTHER): Payer: 59 | Admitting: Family Medicine

## 2020-05-04 ENCOUNTER — Other Ambulatory Visit: Payer: Self-pay | Admitting: Family Medicine

## 2020-05-04 ENCOUNTER — Other Ambulatory Visit: Payer: Self-pay

## 2020-05-04 VITALS — BP 116/72 | HR 77 | Temp 98.1°F | Ht 64.0 in | Wt 145.0 lb

## 2020-05-04 DIAGNOSIS — F419 Anxiety disorder, unspecified: Secondary | ICD-10-CM

## 2020-05-04 DIAGNOSIS — N898 Other specified noninflammatory disorders of vagina: Secondary | ICD-10-CM | POA: Diagnosis not present

## 2020-05-04 LAB — POCT URINALYSIS DIP (MANUAL ENTRY)
Bilirubin, UA: NEGATIVE
Glucose, UA: NEGATIVE mg/dL
Ketones, POC UA: NEGATIVE mg/dL
Leukocytes, UA: NEGATIVE
Nitrite, UA: NEGATIVE
Protein Ur, POC: NEGATIVE mg/dL
Spec Grav, UA: 1.01 (ref 1.010–1.025)
Urobilinogen, UA: 0.2 E.U./dL
pH, UA: 7 (ref 5.0–8.0)

## 2020-05-04 MED ORDER — BUSPIRONE HCL 5 MG PO TABS: 5.0000 mg | ORAL_TABLET | Freq: Every day | ORAL | 3 refills | Status: DC
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
## 2020-05-04 NOTE — Patient Instructions (Addendum)
Will follow up with lab results  Health Maintenance, Female Adopting a healthy lifestyle and getting preventive care are important in promoting health and wellness. Ask your health care provider about:  The right schedule for you to have regular tests and exams.  Things you can do on your own to prevent diseases and keep yourself healthy. What should I know about diet, weight, and exercise? Eat a healthy diet   Eat a diet that includes plenty of vegetables, fruits, low-fat dairy products, and lean protein.  Do not eat a lot of foods that are high in solid fats, added sugars, or sodium. Maintain a healthy weight Body mass index (BMI) is used to identify weight problems. It estimates body fat based on height and weight. Your health care provider can help determine your BMI and help you achieve or maintain a healthy weight. Get regular exercise Get regular exercise. This is one of the most important things you can do for your health. Most adults should:  Exercise for at least 150 minutes each week. The exercise should increase your heart rate and make you sweat (moderate-intensity exercise).  Do strengthening exercises at least twice a week. This is in addition to the moderate-intensity exercise.  Spend less time sitting. Even light physical activity can be beneficial. Watch cholesterol and blood lipids Have your blood tested for lipids and cholesterol at 38 years of age, then have this test every 5 years. Have your cholesterol levels checked more often if:  Your lipid or cholesterol levels are high.  You are older than 38 years of age.  You are at high risk for heart disease. What should I know about cancer screening? Depending on your health history and family history, you may need to have cancer screening at various ages. This may include screening for:  Breast cancer.  Cervical cancer.  Colorectal cancer.  Skin cancer.  Lung cancer. What should I know about heart  disease, diabetes, and high blood pressure? Blood pressure and heart disease  High blood pressure causes heart disease and increases the risk of stroke. This is more likely to develop in people who have high blood pressure readings, are of African descent, or are overweight.  Have your blood pressure checked: ? Every 3-5 years if you are 63-14 years of age. ? Every year if you are 23 years old or older. Diabetes Have regular diabetes screenings. This checks your fasting blood sugar level. Have the screening done:  Once every three years after age 55 if you are at a normal weight and have a low risk for diabetes.  More often and at a younger age if you are overweight or have a high risk for diabetes. What should I know about preventing infection? Hepatitis B If you have a higher risk for hepatitis B, you should be screened for this virus. Talk with your health care provider to find out if you are at risk for hepatitis B infection. Hepatitis C Testing is recommended for:  Everyone born from [birth year] through 1964.  Anyone with known risk factors for hepatitis C. Sexually transmitted infections (STIs)  Get screened for STIs, including gonorrhea and chlamydia, if: ? You are sexually active and are younger than 38 years of age. ? You are older than 38 years of age and your health care provider tells you that you are at risk for this type of infection. ? Your sexual activity has changed since you were last screened, and you are at increased risk for chlamydia or  gonorrhea. Ask your health care provider if you are at risk.  Ask your health care provider about whether you are at high risk for HIV. Your health care provider may recommend a prescription medicine to help prevent HIV infection. If you choose to take medicine to prevent HIV, you should first get tested for HIV. You should then be tested every 3 months for as long as you are taking the medicine. Pregnancy  If you are about to stop  having your period (premenopausal) and you may become pregnant, seek counseling before you get pregnant.  Take 400 to 800 micrograms (mcg) of folic acid every day if you become pregnant.  Ask for birth control (contraception) if you want to prevent pregnancy. Osteoporosis and menopause Osteoporosis is a disease in which the bones lose minerals and strength with aging. This can result in bone fractures. If you are 9 years old or older, or if you are at risk for osteoporosis and fractures, ask your health care provider if you should:  Be screened for bone loss.  Take a calcium or vitamin D supplement to lower your risk of fractures.  Be given hormone replacement therapy (HRT) to treat symptoms of menopause. Follow these instructions at home: Lifestyle  Do not use any products that contain nicotine or tobacco, such as cigarettes, e-cigarettes, and chewing tobacco. If you need help quitting, ask your health care provider.  Do not use street drugs.  Do not share needles.  Ask your health care provider for help if you need support or information about quitting drugs. Alcohol use  Do not drink alcohol if: ? Your health care provider tells you not to drink. ? You are pregnant, may be pregnant, or are planning to become pregnant.  If you drink alcohol: ? Limit how much you use to 0-1 drink a day. ? Limit intake if you are breastfeeding.  Be aware of how much alcohol is in your drink. In the [place] one drink equals one 12 oz bottle of beer (355 mL), one 5 oz glass of wine (148 mL), or one 1 oz glass of hard liquor (44 mL). General instructions  Schedule regular health, dental, and eye exams.  Stay current with your vaccines.  Tell your health care provider if: ? You often feel depressed. ? You have ever been abused or do not feel safe at home. Summary  Adopting a healthy lifestyle and getting preventive care are important in promoting health and wellness.  Follow your health  care provider's instructions about healthy diet, exercising, and getting tested or screened for diseases.  Follow your health care provider's instructions on monitoring your cholesterol and blood pressure. This information is not intended to replace advice given to you by your health care provider. Make sure you discuss any questions you have with your health care provider. Document Revised: 06/03/2018 Document Reviewed: 06/03/2018 Elsevier Patient Education  2020 [employer].

## 2020-05-05 ENCOUNTER — Other Ambulatory Visit: Payer: Self-pay | Admitting: Family Medicine

## 2020-05-05 DIAGNOSIS — B9689 Other specified bacterial agents as the cause of diseases classified elsewhere: Secondary | ICD-10-CM

## 2020-05-05 LAB — WET PREP FOR TRICH, YEAST, CLUE
Clue Cell Exam: POSITIVE — AB
Trichomonas Exam: NEGATIVE
Yeast Exam: NEGATIVE

## 2020-05-05 MED ORDER — METRONIDAZOLE 500 MG PO TABS: 500.0000 mg | ORAL_TABLET | Freq: Two times a day (BID) | ORAL | 0 refills | Status: AC

## 2020-05-15 MED FILL — busPIRone HCL 5 MG TABS: 5 | 90 days supply | Qty: 90 | Fill #0

## 2020-05-22 MED FILL — VALACYCLOVIR HCL 500 MG TAB: 500 | 90 days supply | Qty: 105 | Fill #1

## 2020-06-24 NOTE — L&D Delivery Note (Signed)
I entered the room at 1928 to evaluate patient and fetal status.  The cervix was Completely dilated the breech was at 0 station and the sacrum was to the right with complete breech presentation  However the right foot just kept coming front of the breech despite my efforts while the patient was pushing  FHR was significant for variables some quite deep that progressively had slower recovery.  I was trying to facilitate the breech getting past the feet.  I woiuld push the feet up only to have them reappear in a couple of contractions when patient pushed.  At 2030 or so I made the decision that I was comfortable converting to a breech extraction given her parity and pelvic room soft tissue give fetal size and maternal cooperation  I summoned the delivery team.  Whe they wer in place I told the patient my dilemma and she agreed to go with a delivery of the feet/legs with breech extraction delivery/  At 2046 I delivered the right leg at the end of a contraction and then the left with care.  I then had the patient push and delivered to the level of the shoulder.  There was a left nuchal arm which I relieved without difficulty.  The right arem was extended beyond the head and when I relieved the left nuchal arm the right arm and head delivered quickly together.  The NICU team was present for resuscitation and there was an obstructing mucous plug in the airway.  The baby did well once this was relieved.   At 8:48 PM a viable female was delivered via Vaginal, Breech (Presentation:   complete breech converted to double footling breech for breech extraction   ).  APGAR: , pending; weight  .  pending Placenta status: Spontaneous, Intact.  Cord:   with the following complications:  .  Arterial Cord pH: pending  Anesthesia: Epidural Episiotomy: None Lacerations: None Suture Repair:  Est. Blood Loss (mL): 56  Mom to postpartum.  Baby to Couplet care / Skin to Skin.  Lazaro Arms 03/05/2021, 9:27  PM

## 2020-07-11 ENCOUNTER — Encounter: Payer: BC Managed Care – PPO | Admitting: Family Medicine

## 2020-07-12 ENCOUNTER — Telehealth: Payer: Self-pay | Admitting: Hematology

## 2020-07-12 ENCOUNTER — Ambulatory Visit: Payer: BC Managed Care – PPO | Admitting: Hematology

## 2020-07-12 ENCOUNTER — Encounter: Payer: Self-pay | Admitting: Family Medicine

## 2020-07-12 NOTE — Telephone Encounter (Signed)
R/s appt per 1/19 sch msg. Pt confirmed appt date and time.

## 2020-07-13 ENCOUNTER — Inpatient Hospital Stay: Payer: Self-pay | Admitting: Hematology

## 2020-07-17 ENCOUNTER — Telehealth: Payer: Self-pay

## 2020-07-17 ENCOUNTER — Encounter: Payer: Self-pay | Admitting: Hematology

## 2020-07-17 NOTE — Telephone Encounter (Signed)
Misty Russell left vm stating she is pregnant.  This has been confirmed at her OB/GYN.

## 2020-07-17 NOTE — Telephone Encounter (Signed)
She has a phone visit with me on 2/7, could you call pt to see if she can get lab CBC and CMP done before her visit on 2/7 (unless she had had these lab in pst 1-2 months), or change her appointment to lab and f/u in office here, thanks   Malachy Mood MD

## 2020-07-25 ENCOUNTER — Telehealth: Payer: Self-pay

## 2020-07-25 NOTE — Telephone Encounter (Signed)
I spoke with Misty Russell. She will contact her OB/GYN office to see if they will drawn CBC and CMP.  I provided my office fax number

## 2020-07-27 ENCOUNTER — Other Ambulatory Visit: Payer: Self-pay

## 2020-07-27 DIAGNOSIS — N96 Recurrent pregnancy loss: Secondary | ICD-10-CM

## 2020-07-28 ENCOUNTER — Other Ambulatory Visit: Payer: Self-pay

## 2020-07-28 ENCOUNTER — Inpatient Hospital Stay: Payer: No Typology Code available for payment source | Attending: Hematology

## 2020-07-28 DIAGNOSIS — Z79899 Other long term (current) drug therapy: Secondary | ICD-10-CM | POA: Diagnosis not present

## 2020-07-28 DIAGNOSIS — Z3A01 Less than 8 weeks gestation of pregnancy: Secondary | ICD-10-CM | POA: Diagnosis not present

## 2020-07-28 DIAGNOSIS — O99111 Other diseases of the blood and blood-forming organs and certain disorders involving the immune mechanism complicating pregnancy, first trimester: Secondary | ICD-10-CM | POA: Insufficient documentation

## 2020-07-28 DIAGNOSIS — F419 Anxiety disorder, unspecified: Secondary | ICD-10-CM | POA: Diagnosis not present

## 2020-07-28 DIAGNOSIS — D6861 Antiphospholipid syndrome: Secondary | ICD-10-CM | POA: Insufficient documentation

## 2020-07-28 DIAGNOSIS — N96 Recurrent pregnancy loss: Secondary | ICD-10-CM

## 2020-07-28 DIAGNOSIS — Z7982 Long term (current) use of aspirin: Secondary | ICD-10-CM | POA: Insufficient documentation

## 2020-07-28 LAB — CMP (CANCER CENTER ONLY)
ALT: 14 U/L (ref 0–44)
AST: 16 U/L (ref 15–41)
Albumin: 3.7 g/dL (ref 3.5–5.0)
Alkaline Phosphatase: 64 U/L (ref 38–126)
Anion gap: 8 (ref 5–15)
BUN: 9 mg/dL (ref 6–20)
CO2: 24 mmol/L (ref 22–32)
Calcium: 9 mg/dL (ref 8.9–10.3)
Chloride: 105 mmol/L (ref 98–111)
Creatinine: 0.71 mg/dL (ref 0.44–1.00)
GFR, Estimated: >60 mL/min (ref >60)
Glucose, Bld: 79 mg/dL (ref 70–99)
Potassium: 3.9 mmol/L (ref 3.5–5.1)
Sodium: 137 mmol/L (ref 135–145)
Total Bilirubin: 0.7 mg/dL (ref 0.3–1.2)
Total Protein: 7.6 g/dL (ref 6.5–8.1)

## 2020-07-28 LAB — CBC WITH DIFFERENTIAL (CANCER CENTER ONLY)
Abs Immature Granulocytes: 0.04 10*3/uL (ref 0.00–0.07)
Basophils Absolute: 0 10*3/uL (ref 0.0–0.1)
Basophils Relative: 0 %
Eosinophils Absolute: 0.1 10*3/uL (ref 0.0–0.5)
Eosinophils Relative: 1 %
HCT: 36 % (ref 36.0–46.0)
Hemoglobin: 11.9 g/dL — ABNORMAL LOW (ref 12.0–15.0)
Immature Granulocytes: 0 %
Lymphocytes Relative: 23 %
Lymphs Abs: 2.5 10*3/uL (ref 0.7–4.0)
MCH: 29.3 pg (ref 26.0–34.0)
MCHC: 33.1 g/dL (ref 30.0–36.0)
MCV: 88.7 fL (ref 80.0–100.0)
Monocytes Absolute: 0.7 10*3/uL (ref 0.1–1.0)
Monocytes Relative: 7 %
Neutro Abs: 7.4 10*3/uL (ref 1.7–7.7)
Neutrophils Relative %: 69 %
Platelet Count: 264 10*3/uL (ref 150–400)
RBC: 4.06 MIL/uL (ref 3.87–5.11)
RDW: 13.5 % (ref 11.5–15.5)
WBC Count: 10.9 10*3/uL — ABNORMAL HIGH (ref 4.0–10.5)
nRBC: 0 % (ref 0.0–0.2)
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
## 2020-07-31 ENCOUNTER — Encounter: Payer: Self-pay | Admitting: Hematology

## 2020-07-31 ENCOUNTER — Inpatient Hospital Stay (HOSPITAL_BASED_OUTPATIENT_CLINIC_OR_DEPARTMENT_OTHER): Payer: No Typology Code available for payment source | Admitting: Hematology

## 2020-07-31 ENCOUNTER — Telehealth: Payer: Self-pay | Admitting: Hematology

## 2020-07-31 ENCOUNTER — Other Ambulatory Visit: Payer: Self-pay | Admitting: Hematology

## 2020-07-31 DIAGNOSIS — Z7901 Long term (current) use of anticoagulants: Secondary | ICD-10-CM | POA: Insufficient documentation

## 2020-07-31 DIAGNOSIS — R760 Raised antibody titer: Secondary | ICD-10-CM

## 2020-07-31 HISTORY — DX: Long term (current) use of anticoagulants: Z79.01

## 2020-07-31 MED ORDER — ENOXAPARIN SODIUM 40 MG/0.4ML ~~LOC~~ SOLN: 40.0000 mg | SUBCUTANEOUS | 3 refills | Status: DC

## 2020-07-31 MED FILL — ENOXAPARIN SODIUM 40 MG/0.4: 40 | 30 days supply | Qty: 12 | Fill #0

## 2020-07-31 NOTE — Telephone Encounter (Signed)
Scheduled follow-up appointment per 2/7 los. Patient is aware. °

## 2020-08-08 MED FILL — busPIRone HCL 5 MG TABS: 5 | 90 days supply | Qty: 90 | Fill #1

## 2020-08-08 MED FILL — VALACYCLOVIR HCL 500 MG TAB: 500 | 90 days supply | Qty: 105 | Fill #2

## 2020-08-15 ENCOUNTER — Other Ambulatory Visit: Payer: Self-pay

## 2020-08-15 ENCOUNTER — Other Ambulatory Visit (HOSPITAL_COMMUNITY)
Admission: RE | Admit: 2020-08-15 | Discharge: 2020-08-15 | Disposition: A | Discharge status: Home / Self Care | Payer: No Typology Code available for payment source | Source: Ambulatory Visit | Attending: Family Medicine | Admitting: Family Medicine

## 2020-08-15 ENCOUNTER — Ambulatory Visit (INDEPENDENT_AMBULATORY_CARE_PROVIDER_SITE_OTHER): Payer: No Typology Code available for payment source | Admitting: Family Medicine

## 2020-08-15 ENCOUNTER — Encounter: Payer: Self-pay | Admitting: Family Medicine

## 2020-08-15 VITALS — BP 118/78 | HR 75 | Wt 152.7 lb

## 2020-08-15 DIAGNOSIS — F419 Anxiety disorder, unspecified: Secondary | ICD-10-CM

## 2020-08-15 DIAGNOSIS — Z8619 Personal history of other infectious and parasitic diseases: Secondary | ICD-10-CM

## 2020-08-15 DIAGNOSIS — O0990 Supervision of high risk pregnancy, unspecified, unspecified trimester: Secondary | ICD-10-CM | POA: Insufficient documentation

## 2020-08-15 DIAGNOSIS — R760 Raised antibody titer: Secondary | ICD-10-CM

## 2020-08-15 HISTORY — DX: Supervision of high risk pregnancy, unspecified, unspecified trimester: O09.90

## 2020-08-15 LAB — POCT URINALYSIS DIP (DEVICE)
Bilirubin Urine: NEGATIVE
Glucose, UA: NEGATIVE mg/dL
Hgb urine dipstick: NEGATIVE
Ketones, ur: NEGATIVE mg/dL
Leukocytes,Ua: NEGATIVE
Nitrite: NEGATIVE
Protein, ur: NEGATIVE mg/dL
Specific Gravity, Urine: 1.025 (ref 1.005–1.030)
Urobilinogen, UA: 1 mg/dL (ref 0.0–1.0)
pH: 7 (ref 5.0–8.0)
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
## 2020-08-15 NOTE — Patient Instructions (Signed)

## 2020-08-16 DIAGNOSIS — Z8619 Personal history of other infectious and parasitic diseases: Secondary | ICD-10-CM | POA: Insufficient documentation

## 2020-08-16 LAB — CBC/D/PLT+RPR+RH+ABO+RUB AB...
Antibody Screen: NEGATIVE
Basophils Absolute: 0 10*3/uL (ref 0.0–0.2)
Basos: 0 %
EOS (ABSOLUTE): 0.1 10*3/uL (ref 0.0–0.4)
Eos: 1 %
HCV Ab: <0.1 s/co ratio (ref 0.0–0.9)
HIV Screen 4th Generation wRfx: NONREACTIVE
Hematocrit: 36.2 % (ref 34.0–46.6)
Hemoglobin: 12 g/dL (ref 11.1–15.9)
Hepatitis B Surface Ag: NEGATIVE
Immature Grans (Abs): 0 10*3/uL (ref 0.0–0.1)
Immature Granulocytes: 0 %
Lymphocytes Absolute: 2.6 10*3/uL (ref 0.7–3.1)
Lymphs: 27 %
MCH: 29.3 pg (ref 26.6–33.0)
MCHC: 33.1 g/dL (ref 31.5–35.7)
MCV: 88 fL (ref 79–97)
Monocytes Absolute: 0.6 10*3/uL (ref 0.1–0.9)
Monocytes: 6 %
Neutrophils Absolute: 6.4 10*3/uL (ref 1.4–7.0)
Neutrophils: 66 %
Platelets: 265 10*3/uL (ref 150–450)
RBC: 4.1 x10E6/uL (ref 3.77–5.28)
RDW: 14.4 % (ref 11.7–15.4)
RPR Ser Ql: NONREACTIVE
Rh Factor: POSITIVE
Rubella Antibodies, IGG: 2.39 index (ref >0.99)
WBC: 9.8 10*3/uL (ref 3.4–10.8)

## 2020-08-16 LAB — HEMOGLOBIN A1C
Est. average glucose Bld gHb Est-mCnc: 111 mg/dL
Hgb A1c MFr Bld: 5.5 % (ref 4.8–5.6)

## 2020-08-16 LAB — HCV INTERPRETATION

## 2020-08-17 LAB — GC/CHLAMYDIA PROBE AMP (~~LOC~~) NOT AT ARMC
Chlamydia: NEGATIVE
Comment: NEGATIVE
Comment: NORMAL
Neisseria Gonorrhea: NEGATIVE

## 2020-08-17 LAB — CULTURE, OB URINE

## 2020-08-17 LAB — URINE CULTURE, OB REFLEX

## 2020-08-18 ENCOUNTER — Other Ambulatory Visit: Payer: No Typology Code available for payment source

## 2020-08-22 MED FILL — ENOXAPARIN SODIUM 40 MG/0.4: 40 | 30 days supply | Qty: 12 | Fill #1

## 2020-09-07 ENCOUNTER — Telehealth: Payer: Self-pay | Admitting: Lactation Services

## 2020-09-07 NOTE — Telephone Encounter (Signed)
Returned patients call. She did not answer. Mailbox full and not able to receive messages. My Chart message sent.

## 2020-09-07 NOTE — Telephone Encounter (Signed)
Patient called and LM on Nurse voicemail. She reports she has had a headache for 5 days. She reports she has checked her BP and it has been WNL. Patient would like a call back.

## 2020-09-13 ENCOUNTER — Encounter: Payer: Self-pay | Admitting: Obstetrics & Gynecology

## 2020-09-13 ENCOUNTER — Ambulatory Visit (INDEPENDENT_AMBULATORY_CARE_PROVIDER_SITE_OTHER): Payer: No Typology Code available for payment source | Admitting: Obstetrics & Gynecology

## 2020-09-13 ENCOUNTER — Other Ambulatory Visit: Payer: Self-pay

## 2020-09-13 VITALS — BP 137/76 | HR 119 | Wt 152.0 lb

## 2020-09-13 DIAGNOSIS — R760 Raised antibody titer: Secondary | ICD-10-CM

## 2020-09-13 DIAGNOSIS — Z3A14 14 weeks gestation of pregnancy: Secondary | ICD-10-CM

## 2020-09-13 DIAGNOSIS — O0990 Supervision of high risk pregnancy, unspecified, unspecified trimester: Secondary | ICD-10-CM

## 2020-09-13 NOTE — Patient Instructions (Signed)

## 2020-09-13 NOTE — Progress Notes (Signed)
   PRENATAL VISIT NOTE  Subjective:  Misty Russell is a 40 y.o. [id number] at [redacted]w[redacted]d being seen today for ongoing prenatal care.  She is currently monitored for the following issues for this high-risk pregnancy and has Cervical dysplasia; Adjustment disorder with anxious mood; Anxiety; Antiphospholipid antibody positive; Supervision of high risk pregnancy, antepartum; and History of latent syphilis on their problem list.  Patient reports no obstertric concerns.  On Buspar for anxiety.  Contractions: Not present. Vag. Bleeding: None.  Movement: Absent. Denies leaking of fluid.   The following portions of the patient's history were reviewed and updated as appropriate: allergies, current medications, past family history, past medical history, past social history, past surgical history and problem list.   Objective:   Vitals:   09/13/20 1605  BP: 137/76  Pulse: (!) 119  Weight: 152 lb (68.9 kg)    Fetal Status: Fetal Heart Rate (bpm): 159   Movement: Absent     General:  Alert, oriented and cooperative. Patient is in no acute distress.  Skin: Skin is warm and dry. No rash noted.   Cardiovascular: Normal heart rate noted  Respiratory: Normal respiratory effort, no problems with respiration noted  Abdomen: Soft, gravid, appropriate for gestational age.  Pain/Pressure: Absent     Pelvic: Cervical exam deferred        Extremities: Normal range of motion.  Edema: None  Mental Status: Normal mood and affect. Normal behavior. Normal judgment and thought content.   Assessment and Plan:  Pregnancy: [id number] at [redacted]w[redacted]d 1. Antiphospholipid antibody positive Continue Lovenox as prescribed.  2. [redacted] weeks gestation of pregnancy 3. Supervision of high risk pregnancy, antepartum Anatomy scan scheduled.  Panorama redrawn today. Had a lengthy discussion about anxiety in pregnancy, all issues addressed. Patient is aware of the resources available, she is a Chief Strategy Officer at [employer].  No other complaints or concerns.   Routine obstetric precautions reviewed. Please refer to After Visit Summary for other counseling recommendations.   Return in about 4 weeks (around 10/11/2020) for OFFICE OB VISIT (MD only).  Future Appointments  Date Time Provider Department Center  10/18/2020  2:00 PM [facility] NURSE [facility]  10/18/2020  2:15 PM [facility] US[facility]  10/27/2020  3:00 PM [facility] LAB [facility] None  10/27/2020  3:40 PM Malachy Mood, MD [facility] None    Jaynie Collins, MD

## 2020-09-29 ENCOUNTER — Other Ambulatory Visit (HOSPITAL_COMMUNITY): Payer: Self-pay

## 2020-09-29 MED FILL — Enoxaparin Sodium Inj 40 MG/0.4ML: SUBCUTANEOUS | 30 days supply | Qty: 12 | Fill #0 | Status: AC

## 2020-10-02 ENCOUNTER — Telehealth: Payer: Self-pay | Admitting: Lactation Services

## 2020-10-02 ENCOUNTER — Encounter: Payer: Self-pay | Admitting: *Deleted

## 2020-10-02 NOTE — Telephone Encounter (Signed)
Called patient at her request in regards to Panorama results. She reports she does not have concerns about the results and she reports she did not want to know the gender. She was not informed to not check the results. There is a stray mark on the Panorama request to know fetal sex. Patient reports she is sad but not angry.

## 2020-10-03 ENCOUNTER — Encounter: Payer: Self-pay | Admitting: General Practice

## 2020-10-11 ENCOUNTER — Ambulatory Visit (INDEPENDENT_AMBULATORY_CARE_PROVIDER_SITE_OTHER): Payer: No Typology Code available for payment source | Admitting: Family Medicine

## 2020-10-11 ENCOUNTER — Other Ambulatory Visit (HOSPITAL_COMMUNITY): Payer: Self-pay

## 2020-10-11 VITALS — BP 121/74 | HR 114 | Wt 152.9 lb

## 2020-10-11 DIAGNOSIS — R760 Raised antibody titer: Secondary | ICD-10-CM

## 2020-10-11 DIAGNOSIS — F419 Anxiety disorder, unspecified: Secondary | ICD-10-CM

## 2020-10-11 DIAGNOSIS — O0990 Supervision of high risk pregnancy, unspecified, unspecified trimester: Secondary | ICD-10-CM

## 2020-10-11 MED ORDER — HYDROXYZINE PAMOATE 25 MG PO CAPS
25.0000 mg | ORAL_CAPSULE | Freq: Three times a day (TID) | ORAL | 0 refills | Status: DC | PRN
  Filled 2020-10-11 – 2020-10-20 (×2): qty 30, 10d supply, fill #0

## 2020-10-11 NOTE — Patient Instructions (Signed)

## 2020-10-12 NOTE — Progress Notes (Signed)
   PRENATAL VISIT NOTE  Subjective:  Misty Russell is a 40 y.o. [id number] at [redacted]w[redacted]d being seen today for ongoing prenatal care.  She is currently monitored for the following issues for this high-risk pregnancy and has Cervical dysplasia; Adjustment disorder with anxious mood; Anxiety; Antiphospholipid antibody positive; Supervision of high risk pregnancy, antepartum; and History of latent syphilis on their problem list.  Patient reports anxiety.  Contractions: Not present. Vag. Bleeding: None.  Movement: Present. Denies leaking of fluid.   The following portions of the patient's history were reviewed and updated as appropriate: allergies, current medications, past family history, past medical history, past social history, past surgical history and problem list.   Objective:   Vitals:   10/11/20 1516  BP: 121/74  Pulse: (!) 114  Weight: 152 lb 14.4 oz (69.4 kg)    Fetal Status: Fetal Heart Rate (bpm): 144   Movement: Present     General:  Alert, oriented and cooperative. Patient is in no acute distress.  Skin: Skin is warm and dry. No rash noted.   Cardiovascular: Normal heart rate noted  Respiratory: Normal respiratory effort, no problems with respiration noted  Abdomen: Soft, gravid, appropriate for gestational age.  Pain/Pressure: Absent     Pelvic: Cervical exam deferred        Extremities: Normal range of motion.  Edema: None  Mental Status: Normal mood and affect. Normal behavior. Normal judgment and thought content.   Assessment and Plan:  Pregnancy: [id number] at [redacted]w[redacted]d 1. Supervision of high risk pregnancy, antepartum Has anatomy u/s scheduled - AFP, Serum, Open Spina Bifida  2. Antiphospholipid antibody positive On lovenox  3. Anxiety Having a lot of fear around poor birth outcomes Suggested a doula, mindfulness. On very low dose Buspar--willing to try some Vistaril and see Misty Russell. - Ambulatory referral to Integrated Behavioral [facility] - hydrOXYzine (VISTARIL) 25 MG  capsule; Take 1 capsule (25 mg total) by mouth 3 (three) times daily as needed.  Dispense: 30 capsule; Refill: 0  General obstetric precautions including but not limited to vaginal bleeding, contractions, leaking of fluid and fetal movement were reviewed in detail with the patient. Please refer to After Visit Summary for other counseling recommendations.   Return in 4 weeks (on 11/08/2020).  Future Appointments  Date Time Provider Department Center  10/18/2020  2:00 PM [facility] NURSE [facility]  10/18/2020  2:15 PM [facility]  10/27/2020  3:00 PM [facility] None  10/27/2020  3:40 PM Malachy Mood, MD [facility] None  11/08/2020  2:55 PM [place] Bing, MD [facility]    Reva Bores, MD

## 2020-10-13 LAB — AFP, SERUM, OPEN SPINA BIFIDA
AFP MoM: 1
AFP Value: 51.8 ng/mL
Gest. Age on Collection Date: 18.3 weeks
Maternal Age At EDD: 39 yr
OSBR Risk 1 IN: 10000
Test Results:: NEGATIVE
Weight: 153 [lb_av]

## 2020-10-16 ENCOUNTER — Ambulatory Visit: Payer: No Typology Code available for payment source

## 2020-10-16 ENCOUNTER — Other Ambulatory Visit: Payer: No Typology Code available for payment source

## 2020-10-18 ENCOUNTER — Other Ambulatory Visit: Payer: Self-pay

## 2020-10-18 ENCOUNTER — Encounter: Payer: Self-pay | Admitting: *Deleted

## 2020-10-18 ENCOUNTER — Ambulatory Visit: Payer: No Typology Code available for payment source | Attending: Family Medicine | Admitting: *Deleted

## 2020-10-18 ENCOUNTER — Ambulatory Visit (HOSPITAL_BASED_OUTPATIENT_CLINIC_OR_DEPARTMENT_OTHER): Payer: No Typology Code available for payment source

## 2020-10-18 DIAGNOSIS — F419 Anxiety disorder, unspecified: Secondary | ICD-10-CM | POA: Diagnosis not present

## 2020-10-18 DIAGNOSIS — O09522 Supervision of elderly multigravida, second trimester: Secondary | ICD-10-CM

## 2020-10-18 DIAGNOSIS — O0990 Supervision of high risk pregnancy, unspecified, unspecified trimester: Secondary | ICD-10-CM

## 2020-10-18 DIAGNOSIS — O99342 Other mental disorders complicating pregnancy, second trimester: Secondary | ICD-10-CM | POA: Insufficient documentation

## 2020-10-18 DIAGNOSIS — Z363 Encounter for antenatal screening for malformations: Secondary | ICD-10-CM

## 2020-10-18 DIAGNOSIS — D6861 Antiphospholipid syndrome: Secondary | ICD-10-CM

## 2020-10-18 DIAGNOSIS — Z3A19 19 weeks gestation of pregnancy: Secondary | ICD-10-CM

## 2020-10-18 DIAGNOSIS — O98112 Syphilis complicating pregnancy, second trimester: Secondary | ICD-10-CM | POA: Diagnosis not present

## 2020-10-18 DIAGNOSIS — A530 Latent syphilis, unspecified as early or late: Secondary | ICD-10-CM

## 2020-10-18 DIAGNOSIS — O99112 Other diseases of the blood and blood-forming organs and certain disorders involving the immune mechanism complicating pregnancy, second trimester: Secondary | ICD-10-CM

## 2020-10-18 DIAGNOSIS — Z79899 Other long term (current) drug therapy: Secondary | ICD-10-CM | POA: Insufficient documentation

## 2020-10-18 DIAGNOSIS — Z7901 Long term (current) use of anticoagulants: Secondary | ICD-10-CM

## 2020-10-19 ENCOUNTER — Other Ambulatory Visit (HOSPITAL_COMMUNITY): Payer: Self-pay

## 2020-10-19 ENCOUNTER — Other Ambulatory Visit: Payer: Self-pay | Admitting: *Deleted

## 2020-10-19 DIAGNOSIS — O09522 Supervision of elderly multigravida, second trimester: Secondary | ICD-10-CM

## 2020-10-19 NOTE — Progress Notes (Unsigned)
MFM Brief Consult  Misty Russell is a 40 yo G5P2 here for a detailed anatomy with a diagnosis of antiphospholipid antibody syndrome.  Single intrauterine pregnancy here for a detailed anatomy due to antiphospholipid antibody syndrome. Normal anatomy with measurements consistent with dates There is good fetal movement and amniotic fluid volume  [name] is was given the diagnosis of APLAS but by hematologist but does not meet the criteria for the diagnosis. Her history includes two term deliveries with subsequent chemical pregnancy and a 6-8 weeks SAB.  An APLAS evaluation was performed that resulted in elevated beta 2 glycoprotein (results were not available) a subsequent test was performed 8-9 weeks later however and was again elevated. In reading the notes of Dr. Mosetta Putt Hem/oncologist , noted that she did not meet the clincial criteria for APLAS, however, given she has 2 losses she offered the treatment of prophylactic Lovenox and aspirin and [name] accepted the treatment.  I discussed with [name] that she does not meet the criteria of APLAS and likely does not carry this diagnosis. I discussed the risk of anticoagulation including bleeding and infection. At the end of our discussion she understood but was concerned of discontinuing the treatment given what she has read and heard previously to meeting me today.  At this time, she would like to continue therapy but will weigh her options of discontinuing this treatement, which was my recommendation.   I explained that she should not carry the diagnosis of APLAS going forward in her medical record, no be offered therapy for future sugeries or pregnancy.  Follow up growth in recommended in 4-6 weeks.  All questions answered  I spent 30 minutes with greater than 50% in face to face consultation.  [name], MD.

## 2020-10-20 ENCOUNTER — Other Ambulatory Visit (HOSPITAL_COMMUNITY): Payer: Self-pay

## 2020-10-23 ENCOUNTER — Other Ambulatory Visit (HOSPITAL_COMMUNITY): Payer: Self-pay

## 2020-10-23 MED ORDER — ENOXAPARIN SODIUM 40 MG/0.4ML IJ SOSY
40.0000 mg | PREFILLED_SYRINGE | Freq: Every day | INTRAMUSCULAR | 0 refills | Status: DC
  Filled 2020-10-23: qty 12, 30d supply, fill #0
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
## 2020-10-24 ENCOUNTER — Other Ambulatory Visit (HOSPITAL_COMMUNITY): Payer: Self-pay

## 2020-10-25 ENCOUNTER — Other Ambulatory Visit (HOSPITAL_COMMUNITY): Payer: Self-pay

## 2020-10-26 ENCOUNTER — Other Ambulatory Visit (HOSPITAL_COMMUNITY): Payer: Self-pay

## 2020-10-27 ENCOUNTER — Inpatient Hospital Stay: Payer: No Typology Code available for payment source | Attending: Hematology | Admitting: Hematology

## 2020-10-27 ENCOUNTER — Other Ambulatory Visit: Payer: Self-pay

## 2020-10-27 ENCOUNTER — Inpatient Hospital Stay: Payer: No Typology Code available for payment source

## 2020-10-27 VITALS — BP 121/83 | HR 105 | Temp 96.9°F | Resp 18 | Ht 64.0 in | Wt 156.9 lb

## 2020-10-27 DIAGNOSIS — D649 Anemia, unspecified: Secondary | ICD-10-CM | POA: Diagnosis not present

## 2020-10-27 DIAGNOSIS — Z7901 Long term (current) use of anticoagulants: Secondary | ICD-10-CM | POA: Diagnosis not present

## 2020-10-27 DIAGNOSIS — D6861 Antiphospholipid syndrome: Secondary | ICD-10-CM | POA: Insufficient documentation

## 2020-10-27 DIAGNOSIS — Z7982 Long term (current) use of aspirin: Secondary | ICD-10-CM | POA: Diagnosis not present

## 2020-10-27 DIAGNOSIS — Z79899 Other long term (current) drug therapy: Secondary | ICD-10-CM | POA: Insufficient documentation

## 2020-10-27 DIAGNOSIS — O99112 Other diseases of the blood and blood-forming organs and certain disorders involving the immune mechanism complicating pregnancy, second trimester: Secondary | ICD-10-CM | POA: Insufficient documentation

## 2020-10-27 DIAGNOSIS — F419 Anxiety disorder, unspecified: Secondary | ICD-10-CM | POA: Insufficient documentation

## 2020-10-27 DIAGNOSIS — N96 Recurrent pregnancy loss: Secondary | ICD-10-CM

## 2020-10-27 DIAGNOSIS — R760 Raised antibody titer: Secondary | ICD-10-CM | POA: Diagnosis not present

## 2020-10-27 LAB — CMP (CANCER CENTER ONLY)
ALT: 21 U/L (ref 0–44)
AST: 21 U/L (ref 15–41)
Albumin: 3.1 g/dL — ABNORMAL LOW (ref 3.5–5.0)
Alkaline Phosphatase: 79 U/L (ref 38–126)
Anion gap: 12 (ref 5–15)
BUN: 5 mg/dL — ABNORMAL LOW (ref 6–20)
CO2: 20 mmol/L — ABNORMAL LOW (ref 22–32)
Calcium: 8.8 mg/dL — ABNORMAL LOW (ref 8.9–10.3)
Chloride: 106 mmol/L (ref 98–111)
Creatinine: 0.69 mg/dL (ref 0.44–1.00)
GFR, Estimated: >60 mL/min (ref >60)
Glucose, Bld: 87 mg/dL (ref 70–99)
Potassium: 4.1 mmol/L (ref 3.5–5.1)
Sodium: 138 mmol/L (ref 135–145)
Total Bilirubin: 0.3 mg/dL (ref 0.3–1.2)
Total Protein: 7.1 g/dL (ref 6.5–8.1)

## 2020-10-27 LAB — CBC WITH DIFFERENTIAL (CANCER CENTER ONLY)
Abs Immature Granulocytes: 0.05 10*3/uL (ref 0.00–0.07)
Basophils Absolute: 0 10*3/uL (ref 0.0–0.1)
Basophils Relative: 0 %
Eosinophils Absolute: 0.2 10*3/uL (ref 0.0–0.5)
Eosinophils Relative: 2 %
HCT: 33.9 % — ABNORMAL LOW (ref 36.0–46.0)
Hemoglobin: 11.3 g/dL — ABNORMAL LOW (ref 12.0–15.0)
Immature Granulocytes: 1 %
Lymphocytes Relative: 27 %
Lymphs Abs: 2.9 10*3/uL (ref 0.7–4.0)
MCH: 30.2 pg (ref 26.0–34.0)
MCHC: 33.3 g/dL (ref 30.0–36.0)
MCV: 90.6 fL (ref 80.0–100.0)
Monocytes Absolute: 0.7 10*3/uL (ref 0.1–1.0)
Monocytes Relative: 7 %
Neutro Abs: 6.9 10*3/uL (ref 1.7–7.7)
Neutrophils Relative %: 63 %
Platelet Count: 286 10*3/uL (ref 150–400)
RBC: 3.74 MIL/uL — ABNORMAL LOW (ref 3.87–5.11)
RDW: 13.8 % (ref 11.5–15.5)
WBC Count: 10.7 10*3/uL — ABNORMAL HIGH (ref 4.0–10.5)
nRBC: 0 % (ref 0.0–0.2)

## 2020-10-29 ENCOUNTER — Encounter: Payer: Self-pay | Admitting: Hematology

## 2020-10-30 ENCOUNTER — Telehealth: Payer: Self-pay | Admitting: Lactation Services

## 2020-10-30 NOTE — Telephone Encounter (Signed)
Called patient per her My Chart message.   Patient reports she was prescribed Vistaril and started taking last week, she stopped her Buspar yesterday as she was worried about taking both together. She has concerns with when to start taking and when to stop taking. She is concerned with taking during pregnancy. Reviewed that physician weighed benefit vs risk of using the medication for her during pregnancy. Reviewed not all sources about medication on the Internet are reputable.   Patient asked about taking Buspar during pregnancy, reviewed according to Dr. Tawni Levy note it looks like she should continue but will verify with Dr. Shawnie Pons.   Patient voiced understanding. Routed to Dr. Shawnie Pons for evaluation.

## 2020-10-31 ENCOUNTER — Telehealth: Payer: Self-pay | Admitting: Hematology

## 2020-10-31 NOTE — Telephone Encounter (Signed)
Left message with follow-up appointment per 5/6 los. Gave option to call back to reschedule if needed. 

## 2020-11-01 NOTE — Telephone Encounter (Signed)
Spoke with Dr. Shawnie Pons and she agreed patient can take the Vistaril and continue the Buspar also. Called patient and informed her it is ok.

## 2020-11-08 ENCOUNTER — Other Ambulatory Visit: Payer: Self-pay

## 2020-11-08 ENCOUNTER — Ambulatory Visit (INDEPENDENT_AMBULATORY_CARE_PROVIDER_SITE_OTHER): Payer: No Typology Code available for payment source | Admitting: Obstetrics and Gynecology

## 2020-11-08 ENCOUNTER — Encounter: Payer: Self-pay | Admitting: Obstetrics and Gynecology

## 2020-11-08 VITALS — BP 125/80 | HR 107 | Wt 157.9 lb

## 2020-11-08 DIAGNOSIS — Z8619 Personal history of other infectious and parasitic diseases: Secondary | ICD-10-CM

## 2020-11-08 DIAGNOSIS — O09522 Supervision of elderly multigravida, second trimester: Secondary | ICD-10-CM | POA: Insufficient documentation

## 2020-11-08 DIAGNOSIS — Z3A22 22 weeks gestation of pregnancy: Secondary | ICD-10-CM

## 2020-11-08 DIAGNOSIS — O0990 Supervision of high risk pregnancy, unspecified, unspecified trimester: Secondary | ICD-10-CM

## 2020-11-08 DIAGNOSIS — F419 Anxiety disorder, unspecified: Secondary | ICD-10-CM

## 2020-11-08 DIAGNOSIS — Z7901 Long term (current) use of anticoagulants: Secondary | ICD-10-CM

## 2020-11-08 DIAGNOSIS — F4322 Adjustment disorder with anxiety: Secondary | ICD-10-CM

## 2020-11-08 HISTORY — DX: Supervision of elderly multigravida, second trimester: O09.522

## 2020-11-08 NOTE — Progress Notes (Signed)
   PRENATAL VISIT NOTE  Subjective:  Misty Russell is a 40 y.o. [id number] at [redacted]w[redacted]d being seen today for ongoing prenatal care.  She is currently monitored for the following issues for this high-risk pregnancy and has Adjustment disorder with anxious mood; Anxiety; Anticoagulation in pregnancy; Supervision of high risk pregnancy, antepartum; History of latent syphilis; and AMA (advanced maternal age) multigravida 35+, second trimester on their problem list.  Patient reports anxiety is improving especially with the vistaril that she's using qday..  Contractions: Not present. Vag. Bleeding: None.  Movement: Present. Denies leaking of fluid.   The following portions of the patient's history were reviewed and updated as appropriate: allergies, current medications, past family history, past medical history, past social history, past surgical history and problem list.   Objective:   Vitals:   11/08/20 1509  BP: 125/80  Pulse: (!) 107  Weight: 157 lb 14.4 oz (71.6 kg)    Fetal Status: Fetal Heart Rate (bpm): 155 Fundal Height: 22 cm Movement: Present     General:  Alert, oriented and cooperative. Patient is in no acute distress.  Skin: Skin is warm and dry. No rash noted.   Cardiovascular: Normal heart rate noted  Respiratory: Normal respiratory effort, no problems with respiration noted  Abdomen: Soft, gravid, appropriate for gestational age.  Pain/Pressure: Absent     Pelvic: Cervical exam deferred        Extremities: Normal range of motion.  Edema: None  Mental Status: Normal mood and affect. Normal behavior. Normal judgment and thought content.   Assessment and Plan:  Pregnancy: [id number] at [redacted]w[redacted]d 1. Supervision of high risk pregnancy, antepartum Routine care  2. History of latent syphilis rpr neg at nob. F/u with gtt  3. AMA (advanced maternal age) multigravida 35+, second trimester No issues  4. Adjustment disorder with anxious mood See above. Doing well on buspar and vistaril    5. Anticoagulation in pregnancy On lovenox 40 qday. See prior notes. Pt would like to stay on it, which I feel is reasonable. I told her I'd recommend staying on it and not transitioning to heparin with delivery at 39wks, stopping the lovenox 24h beforehand. I also told her I recommend serial u/s during the rest of the pregnancy  6. Anxiety  7. [redacted] weeks gestation of pregnancy  Preterm labor symptoms and general obstetric precautions including but not limited to vaginal bleeding, contractions, leaking of fluid and fetal movement were reviewed in detail with the patient. Please refer to After Visit Summary for other counseling recommendations.   Return in 3 weeks (on 11/29/2020) for in person, high risk, md visit.  Future Appointments  Date Time Provider Department Center  11/29/2020  2:15 PM Reva Bores, MD [facility]  11/29/2020  3:30 PM [facility] NURSE [facility]  11/29/2020  3:45 PM [facility] US5 [facility]  01/26/2021  3:30 PM [facility] LAB [facility] None  01/26/2021  4:00 PM Malachy Mood, MD [facility] None    Verdon Bing, MD

## 2020-11-13 ENCOUNTER — Other Ambulatory Visit: Payer: Self-pay | Admitting: Family Medicine

## 2020-11-13 ENCOUNTER — Other Ambulatory Visit (HOSPITAL_COMMUNITY): Payer: Self-pay

## 2020-11-13 MED FILL — Buspirone HCl Tab 5 MG: ORAL | 90 days supply | Qty: 90 | Fill #0 | Status: AC

## 2020-11-14 ENCOUNTER — Other Ambulatory Visit: Payer: Self-pay | Admitting: Obstetrics & Gynecology

## 2020-11-14 ENCOUNTER — Other Ambulatory Visit (HOSPITAL_COMMUNITY): Payer: Self-pay

## 2020-11-15 ENCOUNTER — Other Ambulatory Visit (HOSPITAL_COMMUNITY): Payer: Self-pay

## 2020-11-15 ENCOUNTER — Other Ambulatory Visit: Payer: Self-pay | Admitting: Obstetrics & Gynecology

## 2020-11-21 ENCOUNTER — Encounter (HOSPITAL_COMMUNITY): Payer: Self-pay | Admitting: Obstetrics and Gynecology

## 2020-11-21 ENCOUNTER — Telehealth: Payer: Self-pay | Admitting: *Deleted

## 2020-11-21 ENCOUNTER — Inpatient Hospital Stay (HOSPITAL_COMMUNITY)
Admission: AD | Admit: 2020-11-21 | Discharge: 2020-11-21 | Disposition: A | Discharge status: Home / Self Care | Payer: No Typology Code available for payment source | Attending: Obstetrics and Gynecology | Admitting: Obstetrics and Gynecology

## 2020-11-21 ENCOUNTER — Other Ambulatory Visit: Payer: Self-pay

## 2020-11-21 DIAGNOSIS — O368120 Decreased fetal movements, second trimester, not applicable or unspecified: Secondary | ICD-10-CM | POA: Insufficient documentation

## 2020-11-21 DIAGNOSIS — O09522 Supervision of elderly multigravida, second trimester: Secondary | ICD-10-CM | POA: Insufficient documentation

## 2020-11-21 DIAGNOSIS — Z79899 Other long term (current) drug therapy: Secondary | ICD-10-CM | POA: Diagnosis not present

## 2020-11-21 DIAGNOSIS — Z3A24 24 weeks gestation of pregnancy: Secondary | ICD-10-CM | POA: Diagnosis not present

## 2020-11-21 LAB — URINALYSIS, ROUTINE W REFLEX MICROSCOPIC
Bilirubin Urine: NEGATIVE
Glucose, UA: NEGATIVE mg/dL
Hgb urine dipstick: NEGATIVE
Ketones, ur: NEGATIVE mg/dL
Leukocytes,Ua: NEGATIVE
Nitrite: NEGATIVE
Protein, ur: NEGATIVE mg/dL
Specific Gravity, Urine: 1.006 (ref 1.005–1.030)
pH: 7 (ref 5.0–8.0)

## 2020-11-21 NOTE — Telephone Encounter (Signed)
I called Misty Russell to discuss her MyChart message re: decreased fetal movement.  She reports her baby is normally" pretty active" in early morning and then a  Little less throughtout the day. Reports noticed on Saturday and everyday since then baby not moving as much- states moving " just a little".  I asked her to estimate number of movements. She reports usually 15-20 or more daily but Saturday, Sunday, Monday less than 10 and today only twice.   Per review is [redacted]w[redacted]d, 40 years old, on lovenox .  I discussed patient history and complaints with Cheri Guppy, CNM and was advised to send patient to hospital for evaluation.  I advised [place] we recommend she go to the hospital [facility] MAU for evaluation asap for decreased fetal movement. She voices understanding. Casen Pryor,RN
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
## 2020-11-21 NOTE — Discharge Instructions (Signed)
Fetal Movement Counts Patient Name: ________________________________________________ Patient Due Date: ____________________  What is a fetal movement count? A fetal movement count is the number of times that you feel your baby move during a certain amount of time. This may also be called a fetal kick count. A fetal movement count is recommended for every pregnant woman. You may be asked to start counting fetal movements as early as week 28 of your pregnancy. Pay attention to when your baby is most active. You may notice your baby's sleep and wake cycles. You may also notice things that make your baby move more. You should do a fetal movement count:  When your baby is normally most active.  At the same time each day. A good time to count movements is while you are resting, after having something to eat and drink. How do I count fetal movements? 1. Find a quiet, comfortable area. Sit, or lie down on your side. 2. Write down the date, the start time and stop time, and the number of movements that you felt between those two times. Take this information with you to your health care visits. 3. Write down your start time when you feel the first movement. 4. Count kicks, flutters, swishes, rolls, and jabs. You should feel at least 10 movements. 5. You may stop counting after you have felt 10 movements, or if you have been counting for 2 hours. Write down the stop time. 6. If you do not feel 10 movements in 2 hours, contact your health care provider for further instructions. Your health care provider may want to do additional tests to assess your baby's well-being. Contact a health care provider if:  You feel fewer than 10 movements in 2 hours.  Your baby is not moving like he or she usually does. Date: ____________ Start time: ____________ Stop time: ____________ Movements: ____________ Date: ____________ Start time: ____________ Stop time: ____________ Movements: ____________ Date: ____________  Start time: ____________ Stop time: ____________ Movements: ____________ Date: ____________ Start time: ____________ Stop time: ____________ Movements: ____________ Date: ____________ Start time: ____________ Stop time: ____________ Movements: ____________ Date: ____________ Start time: ____________ Stop time: ____________ Movements: ____________ Date: ____________ Start time: ____________ Stop time: ____________ Movements: ____________ Date: ____________ Start time: ____________ Stop time: ____________ Movements: ____________ Date: ____________ Start time: ____________ Stop time: ____________ Movements: ____________ This information is not intended to replace advice given to you by your health care provider. Make sure you discuss any questions you have with your health care provider. Document Revised: 01/28/2019 Document Reviewed: 01/28/2019 Elsevier Patient Education  2021 Elsevier Inc.  

## 2020-11-21 NOTE — MAU Note (Signed)
Presents with c/o decreased FM since Saturday.  Denies LOF or VB.  Reports having fetal movement but less than usual.

## 2020-11-27 ENCOUNTER — Other Ambulatory Visit (HOSPITAL_COMMUNITY): Payer: Self-pay

## 2020-11-27 ENCOUNTER — Encounter: Payer: Self-pay | Admitting: *Deleted

## 2020-11-27 ENCOUNTER — Other Ambulatory Visit: Payer: Self-pay | Admitting: *Deleted

## 2020-11-27 DIAGNOSIS — F419 Anxiety disorder, unspecified: Secondary | ICD-10-CM

## 2020-11-27 MED ORDER — HYDROXYZINE PAMOATE 25 MG PO CAPS
25.0000 mg | ORAL_CAPSULE | Freq: Three times a day (TID) | ORAL | 0 refills | Status: DC | PRN
  Filled 2020-11-27: qty 30, 10d supply, fill #0

## 2020-11-27 MED ORDER — ENOXAPARIN SODIUM 40 MG/0.4ML IJ SOSY
40.0000 mg | PREFILLED_SYRINGE | Freq: Every day | INTRAMUSCULAR | 0 refills | Status: DC
  Filled 2020-11-27: qty 12, 30d supply, fill #0

## 2020-11-27 NOTE — Addendum Note (Signed)
Addended by: Jill Side on: 11/27/2020 05:36 PM   Modules accepted: Orders

## 2020-11-28 ENCOUNTER — Telehealth: Payer: Self-pay

## 2020-11-28 ENCOUNTER — Other Ambulatory Visit (HOSPITAL_COMMUNITY): Payer: Self-pay

## 2020-11-28 MED ORDER — VALACYCLOVIR HCL 500 MG PO TABS
500.0000 mg | ORAL_TABLET | Freq: Every day | ORAL | 0 refills | Status: DC
  Filled 2020-11-28: qty 90, 90d supply, fill #0

## 2020-11-28 NOTE — Telephone Encounter (Signed)
Pt called and I informed her that we needed to make sure that what we are prescribing is what she is needing.  I explained to the patient that we had not prescribed that for her in the past it was Dr. Juliene Pina or Dr. Seymour Bars and we just needed to make sure that she is not having a current outbreak.  Pt apologized and stated that she did not realize we were different practices.  Pt informed me that she wants the medication for suppression.  Per Dr. Crissie Reese pt can have Valtrex 500 mg po daily prescribed for suppression.  Pt advised that the medication will be sent to her [facility].  Pt verbalized understanding.   Leonette Nutting  11/28/20

## 2020-11-29 ENCOUNTER — Other Ambulatory Visit: Payer: Self-pay

## 2020-11-29 ENCOUNTER — Telehealth (INDEPENDENT_AMBULATORY_CARE_PROVIDER_SITE_OTHER): Payer: No Typology Code available for payment source | Admitting: Certified Nurse Midwife

## 2020-11-29 ENCOUNTER — Encounter: Payer: No Typology Code available for payment source | Admitting: Family Medicine

## 2020-11-29 ENCOUNTER — Ambulatory Visit: Payer: No Typology Code available for payment source | Attending: Maternal & Fetal Medicine

## 2020-11-29 ENCOUNTER — Ambulatory Visit: Payer: No Typology Code available for payment source | Admitting: Certified Nurse Midwife

## 2020-11-29 ENCOUNTER — Encounter: Payer: Self-pay | Admitting: *Deleted

## 2020-11-29 ENCOUNTER — Ambulatory Visit: Payer: No Typology Code available for payment source | Admitting: *Deleted

## 2020-11-29 DIAGNOSIS — O0990 Supervision of high risk pregnancy, unspecified, unspecified trimester: Secondary | ICD-10-CM

## 2020-11-29 DIAGNOSIS — O09522 Supervision of elderly multigravida, second trimester: Secondary | ICD-10-CM | POA: Diagnosis not present

## 2020-11-29 DIAGNOSIS — O99112 Other diseases of the blood and blood-forming organs and certain disorders involving the immune mechanism complicating pregnancy, second trimester: Secondary | ICD-10-CM | POA: Diagnosis not present

## 2020-11-29 DIAGNOSIS — Z3A25 25 weeks gestation of pregnancy: Secondary | ICD-10-CM

## 2020-11-29 DIAGNOSIS — O98112 Syphilis complicating pregnancy, second trimester: Secondary | ICD-10-CM

## 2020-11-29 DIAGNOSIS — A530 Latent syphilis, unspecified as early or late: Secondary | ICD-10-CM

## 2020-11-29 DIAGNOSIS — Z362 Encounter for other antenatal screening follow-up: Secondary | ICD-10-CM | POA: Diagnosis not present

## 2020-11-29 DIAGNOSIS — O99891 Other specified diseases and conditions complicating pregnancy: Secondary | ICD-10-CM

## 2020-11-29 DIAGNOSIS — O321XX0 Maternal care for breech presentation, not applicable or unspecified: Secondary | ICD-10-CM

## 2020-11-29 DIAGNOSIS — R4589 Other symptoms and signs involving emotional state: Secondary | ICD-10-CM

## 2020-11-29 DIAGNOSIS — D6861 Antiphospholipid syndrome: Secondary | ICD-10-CM

## 2020-11-29 DIAGNOSIS — O0992 Supervision of high risk pregnancy, unspecified, second trimester: Secondary | ICD-10-CM

## 2020-11-29 NOTE — Progress Notes (Signed)
I connected with  Misty Russell on 11/29/20 at 10:55 AM EDT by MyChart Virtual Video Visit and verified that I am speaking with the correct person using two identifiers.   I discussed the limitations, risks, security and privacy concerns of performing an evaluation and management service by telephone and the availability of in person appointments. I also discussed with the patient that there may be a patient responsible charge related to this service. The patient expressed understanding and agreed to proceed.  Guy Begin, CMA 11/29/2020  10:52 AM

## 2020-11-29 NOTE — Progress Notes (Signed)
Ms Clemson stated that she does experience slight "pressure" on left side of pelvis in the mornings. Denies any pain or vaginal bleeding also stated baby moves daily. Overall patient is doing well

## 2020-11-30 NOTE — Progress Notes (Signed)
OBSTETRICS PRENATAL VIRTUAL VISIT ENCOUNTER NOTE  Provider location: [facility]   Patient location: Home (then MFM)  I attempted to connect with Ayeza B Cyran on 11/30/20 at 10:55 AM EDT by MyChart Video Encounter. She had logged off (was at work and waited as long as she could but got called away from the phone). I sent a MyChart message and offered to see patient at her MFM later in the afternoon. Patient amenable to plan, so converted visit to an in-person visit conducted in the MFM antenatal room.  Subjective:  Misty Russell is a 40 y.o. [id number] at [redacted]w[redacted]d being seen today for ongoing prenatal care.  She is currently monitored for the following issues for this high-risk pregnancy and has Adjustment disorder with anxious mood; Anxiety; Anticoagulation in pregnancy; Supervision of high risk pregnancy, antepartum; History of latent syphilis; and AMA (advanced maternal age) multigravida 35+, second trimester on their problem list.  Patient reports  no physical complaints but still feeling anxious about the delivery and postpartum period (albeit less since starting vistaril). She fears dying with almost constant intrusive thoughts. It is bad enough that she has put together a list of all contacts/routines for her husband in case she dies in childbirth. 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  Contractions: Not present. Vag. Bleeding: None.  Movement: Present. Denies any leaking of fluid.   The following portions of the patient's history were reviewed and updated as appropriate: allergies, current medications, past family history, past medical history, past social history, past surgical history and problem list.   Objective:  See MFM visit for vitals  Fetal Status:     Movement: Present   See MFM report below.  General:  Alert, oriented and cooperative. Patient is in no acute distress.  Respiratory: Normal  respiratory effort, no problems with respiration noted  Mental Status: Normal mood and affect. Normal behavior. Normal judgment and thought content.  Cervical exam deferred.  Imaging: [place] MFM OB FOLLOW UP  Result Date: 11/29/2020 ----------------------------------------------------------------------  OBSTETRICS REPORT                       (Signed Final 11/29/2020 05:47 pm) ---------------------------------------------------------------------- Patient Info  ID #:       [id number]                          D.O.B.:  [birth year] (40 yrs)  Name:       HAROLD MATTES                Visit Date: 11/29/2020 03:53 pm ---------------------------------------------------------------------- Performed By  Attending:        Noralee Space MD        Ref. Address:     [address]  [place], [place]                                                             [address]  Performed By:     Eden Lathe BS      [facility]  Referred By:      Venora Maples MD ---------------------------------------------------------------------- Orders  #  Description                           Code        Ordered By  1  [place] MFM OB FOLLOW UP                   [id number]    Lin Landsman ----------------------------------------------------------------------  #  Order #                     Accession #                Episode #  1  [id number]                   [id number]                 [id number] ---------------------------------------------------------------------- Indications  [redacted] weeks gestation of pregnancy                 Z3A.25  Encounter for other antenatal screening        Z36.2  follow-up  Antiphospholipid syndrome complicating         O99.119, D68.61  pregnancy ruled olut, antepartum (Lovenox)  Medical complication of pregnancy (latent      O26.90  syphilis)  Advanced maternal age multigravida 35+,  Z61.096  second trimester (39) ---------------------------------------------------------------------- Fetal Evaluation  Num Of Fetuses:         1  Fetal Heart Rate(bpm):  155  Cardiac Activity:       Observed  Presentation:           Breech  Placenta:               Posterior  P. Cord Insertion:      Previously Visualized  Amniotic Fluid  AFI FV:      Within normal limits                              Largest Pocket(cm)                              4.7 ---------------------------------------------------------------------- Biometry  BPD:      58.7  mm     G. Age:  24w 0d          6  %    CI:        67.94   %    70 - 86                                                          FL/HC:      19.4   %    18.7 - 20.3  HC:      227.8  mm     G. Age:  24w 6d         11  %    HC/AC:      1.04        1.04 - 1.22  AC:      219.1  mm     G. Age:  26w 3d         71  %    FL/BPD:     75.5   %    71 - 87  FL:       44.3  mm     G. Age:  24w 4d         14  %    FL/AC:      20.2   %    20 - 24  HUM:      39.1  mm     G. Age:  24w 0d          8  %  LV:        3.2  mm  Est. FW:     811  gm    1 lb 13 oz      40  % ---------------------------------------------------------------------- OB History  Gravidity:    5  Living:       2 ---------------------------------------------------------------------- Gestational Age  LMP:           33w 1d        Date:  04/11/20                 EDD:   01/16/21  U/S Today:     25w 0d  EDD:   03/14/21  Best:          Alcus Dad 3d     Det. By:  Previous Ultrasound      EDD:   03/11/21 ---------------------------------------------------------------------- Anatomy  Cranium:                Appears normal         Aortic Arch:            Appears normal  Cavum:                 Appears normal         Ductal Arch:            Appears normal  Ventricles:            Appears normal         Diaphragm:              Appears normal  Choroid Plexus:        Previously seen        Stomach:                Appears normal, left                                                                        sided  Cerebellum:            Appears normal         Abdomen:                Appears normal  Posterior Fossa:       Appears normal         Abdominal Wall:         Appears nml (cord                                                                        insert, abd wall)  Nuchal Fold:           Appears normal         Cord Vessels:           Appears normal (3                                                                        vessel cord)  Face:                  Appears normal         Kidneys:                Appear normal                         (  orbits and profile)  Lips:                  Appears normal         Bladder:                Appears normal  Thoracic:              Appears normal         Spine:                  Previously seen  Heart:                 Appears normal         Upper Extremities:      Previously seen                         (4CH, axis, and                         situs)  RVOT:                  Appears normal         Lower Extremities:      Previously seen  LVOT:                  Appears normal  Other:  Female gender previously visualized. Lenses visualized. Nasal bone          visualized. 3VV visualized. ---------------------------------------------------------------------- Cervix Uterus Adnexa  Cervix  Not visualized (advanced GA >24wks)  Uterus  No abnormality visualized.  Right Ovary  Not visualized.  Left Ovary  Not visualized.  Cul De Sac  No free fluid seen.  Adnexa  No abnormality visualized. ---------------------------------------------------------------------- Impression  Patient returned for  fetal growth assessment.  Patient does  not have antiphospholipid syndrome (see previous note), but  would like to continue Lovenox.  Fetal growth is appropriate for gestational age.  Amniotic fluid  is normal and good fetal activity seen.  We reassured the patient of the findings. ---------------------------------------------------------------------- Recommendations  Follow-up scans as clinically indicated. ----------------------------------------------------------------------                  Noralee Spaceavi Shankar, MD Electronically Signed Final Report   11/29/2020 05:47 pm ----------------------------------------------------------------------   Assessment and Plan:  Pregnancy: [id number] at 7152w4d 1. Supervision of high risk pregnancy, antepartum - Doing physically well, feeling regular and vigorous fetal movement  2. [redacted] weeks gestation of pregnancy - Routine OB care  3. Feeling anxious - Validated patient's fears/feelings and had a lengthy conversation regarding the measures [facility] taking to mitigate the [facility] health disparities - Copious reassurance given regarding appropriately timed cessation of Lovenox and reviewed the meds used to prevent/treat hemorrhage in labors - Encouraged use of a doula, pt qualifies for no-cost doula and is on list for doula care. -  Recommended pt remain with two providers for the rest of her prenatal care, to provide continuity of care and ease her anxiety. - Pt expressed relief and understanding, amenable to plans.  Preterm labor symptoms and general obstetric precautions including but not limited to vaginal bleeding, contractions, leaking of fluid and fetal movement were reviewed in detail with the patient. I discussed the assessment and treatment plan with the patient. The patient was provided an opportunity to ask questions and all were answered. The patient agreed with the plan and demonstrated an  understanding of the instructions. The patient was advised to call back or  seek an in-person office evaluation/go to [facility]U at [facility] for any urgent or concerning symptoms. Please refer to After Visit Summary for other counseling recommendations.   I provided 20 minutes of face-to-face time during this encounter.  Follow up in 2-3 weeks for LOB with GTT.  Future Appointments  Date Time Provider Department Center  01/26/2021  3:30 PM [facility] None  01/26/2021  4:00 PM Malachy Mood, MD [facility] None    Bernerd Limbo, CNM [facility] [employer]

## 2020-11-30 NOTE — Progress Notes (Signed)
See note from am appointment.

## 2020-12-01 ENCOUNTER — Other Ambulatory Visit (HOSPITAL_COMMUNITY): Payer: Self-pay

## 2020-12-11 ENCOUNTER — Other Ambulatory Visit: Payer: Self-pay

## 2020-12-11 DIAGNOSIS — O0990 Supervision of high risk pregnancy, unspecified, unspecified trimester: Secondary | ICD-10-CM

## 2020-12-13 ENCOUNTER — Other Ambulatory Visit: Payer: No Typology Code available for payment source

## 2020-12-13 ENCOUNTER — Ambulatory Visit (INDEPENDENT_AMBULATORY_CARE_PROVIDER_SITE_OTHER): Payer: No Typology Code available for payment source | Admitting: Certified Nurse Midwife

## 2020-12-13 ENCOUNTER — Other Ambulatory Visit: Payer: Self-pay

## 2020-12-13 VITALS — BP 116/84 | HR 106 | Wt 161.1 lb

## 2020-12-13 DIAGNOSIS — Z3A27 27 weeks gestation of pregnancy: Secondary | ICD-10-CM

## 2020-12-13 DIAGNOSIS — O0992 Supervision of high risk pregnancy, unspecified, second trimester: Secondary | ICD-10-CM

## 2020-12-13 DIAGNOSIS — O0990 Supervision of high risk pregnancy, unspecified, unspecified trimester: Secondary | ICD-10-CM

## 2020-12-13 DIAGNOSIS — O24410 Gestational diabetes mellitus in pregnancy, diet controlled: Secondary | ICD-10-CM

## 2020-12-14 ENCOUNTER — Telehealth: Payer: Self-pay | Admitting: General Practice

## 2020-12-14 DIAGNOSIS — O24410 Gestational diabetes mellitus in pregnancy, diet controlled: Secondary | ICD-10-CM

## 2020-12-14 LAB — CBC
Hematocrit: 37.3 % (ref 34.0–46.6)
Hemoglobin: 11.8 g/dL (ref 11.1–15.9)
MCH: 29 pg (ref 26.6–33.0)
MCHC: 31.6 g/dL (ref 31.5–35.7)
MCV: 92 fL (ref 79–97)
Platelets: 284 10*3/uL (ref 150–450)
RBC: 4.07 x10E6/uL (ref 3.77–5.28)
RDW: 13 % (ref 11.7–15.4)
WBC: 9.4 10*3/uL (ref 3.4–10.8)

## 2020-12-14 LAB — GLUCOSE TOLERANCE, 2 HOURS W/ 1HR
Glucose, 1 hour: 174 mg/dL (ref 65–179)
Glucose, 2 hour: 169 mg/dL — ABNORMAL HIGH (ref 65–152)
Glucose, Fasting: 80 mg/dL (ref 65–91)

## 2020-12-14 LAB — HIV ANTIBODY (ROUTINE TESTING W REFLEX): HIV Screen 4th Generation wRfx: NONREACTIVE

## 2020-12-14 LAB — RPR: RPR Ser Ql: NONREACTIVE

## 2020-12-14 NOTE — Progress Notes (Signed)
   PRENATAL VISIT NOTE  Subjective:  Misty Russell is a 40 y.o. [id number] at [redacted]w[redacted]d being seen today for ongoing prenatal care.  She is currently monitored for the following issues for this high-risk pregnancy and has Adjustment disorder with anxious mood; Anxiety; Anticoagulation in pregnancy; Supervision of high risk pregnancy, antepartum; History of latent syphilis; and AMA (advanced maternal age) multigravida 35+, second trimester on their problem list.  Patient reports  no physical complaints, continues to feel like anxiety is well controlled using vistaril as an adjunct to her buspar. Wants to discuss long-term management of anxiety if it continues postpartum. Also wants to discuss need for IOL r/t her high risk pregnancy status .  Contractions: Not present. Vag. Bleeding: None.  Movement: Present. Denies leaking of fluid.   The following portions of the patient's history were reviewed and updated as appropriate: allergies, current medications, past family history, past medical history, past social history, past surgical history and problem list.   Objective:   Vitals:   12/13/20 1034  BP: 116/84  Pulse: (!) 106  Weight: 161 lb 1.6 oz (73.1 kg)    Fetal Status: Fetal Heart Rate (bpm): 156 Fundal Height: 28 cm Movement: Present     General:  Alert, oriented and cooperative. Patient is in no acute distress.  Skin: Skin is warm and dry. No rash noted.   Cardiovascular: Normal heart rate noted  Respiratory: Normal respiratory effort, no problems with respiration noted  Abdomen: Soft, gravid, appropriate for gestational age.  Pain/Pressure: Absent     Pelvic: Cervical exam deferred        Extremities: Normal range of motion.  Edema: None  Mental Status: Normal mood and affect. Normal behavior. Normal judgment and thought content.   Assessment and Plan:  Pregnancy: E7O3500 at [redacted]w[redacted]d 1. Supervision of high risk pregnancy in second trimester - Doing well, feeling regular and vigorous  fetal movement - Will review timing of cessation of anticoagulants and IOL if needed (per U2D, IOL is indicated only to control cessation of anticoagulants - in the absence of other risk factors) with Dr. Shawnie Pons and discuss at next visit  2. [redacted] weeks gestation of pregnancy - Routine OB care including glucose tolerance test and 3rd trimester labs - Suggested continued use of vistaril and can either switch therapies (wellbutrin may work better for her postpartum) or add adjunct anxiolytic if needed when postpartum. Pt agreed with plan.  Preterm labor symptoms and general obstetric precautions including but not limited to vaginal bleeding, contractions, leaking of fluid and fetal movement were reviewed in detail with the patient. Please refer to After Visit Summary for other counseling recommendations.   Return in about 2 weeks (around 12/27/2020) for IN-PERSON, HROB.  Future Appointments  Date Time Provider Department Center  12/27/2020  2:15 PM Osborne Oman [facility]  01/26/2021  3:30 PM [facility] None  01/26/2021  4:00 PM Malachy Mood, MD [facility] None    Bernerd Limbo, CNM

## 2020-12-14 NOTE — Telephone Encounter (Signed)
Called patient regarding mychart message. Informed her of diabetes with this pregnancy. Discussed appt with Marylene Land and what to expect. Also discussed more frequent ultrasounds to monitor growth of baby. Offered appt next week at 215 with [facility] & patient verbalized understanding to all. Patient asked if there was anything else she needed to do between now & then. Told patient no, just continue to drink plenty of water and try to be mindful about eating healthfully. Patient verbalized understanding.

## 2020-12-21 ENCOUNTER — Other Ambulatory Visit: Payer: Self-pay

## 2020-12-21 ENCOUNTER — Other Ambulatory Visit (HOSPITAL_COMMUNITY): Payer: Self-pay

## 2020-12-21 ENCOUNTER — Other Ambulatory Visit: Payer: Self-pay | Admitting: Certified Nurse Midwife

## 2020-12-21 ENCOUNTER — Ambulatory Visit: Payer: No Typology Code available for payment source | Admitting: Registered"

## 2020-12-21 ENCOUNTER — Encounter: Payer: No Typology Code available for payment source | Attending: Certified Nurse Midwife | Admitting: Registered"

## 2020-12-21 DIAGNOSIS — Z3A00 Weeks of gestation of pregnancy not specified: Secondary | ICD-10-CM | POA: Insufficient documentation

## 2020-12-21 DIAGNOSIS — O24419 Gestational diabetes mellitus in pregnancy, unspecified control: Secondary | ICD-10-CM

## 2020-12-21 MED ORDER — GLUCOSE BLOOD VI STRP
ORAL_STRIP | 12 refills | Status: DC
  Filled 2020-12-21: qty 300, 75d supply, fill #0

## 2020-12-21 MED ORDER — GLUCOSE BLOOD VI STRP
ORAL_STRIP | 12 refills | Status: DC
  Filled 2020-12-21: qty 100, fill #0

## 2020-12-21 MED ORDER — FREESTYLE LANCETS MISC
12 refills | Status: DC
  Filled 2020-12-21: qty 100, fill #0
  Filled 2020-12-22: qty 300, 75d supply, fill #0

## 2020-12-21 MED ORDER — FREESTYLE SYSTEM KIT
1.0000 | PACK | 0 refills | Status: DC | PRN
  Filled 2020-12-21: qty 1, fill #0

## 2020-12-21 MED ORDER — FREESTYLE LITE W/DEVICE KIT
1.0000 [IU] | PACK | Freq: Four times a day (QID) | 0 refills | Status: DC
  Filled 2020-12-21: qty 1, fill #0
  Filled 2020-12-22: qty 1, 30d supply, fill #0

## 2020-12-21 NOTE — Progress Notes (Signed)
Patient was seen on 12/21/20 for Gestational Diabetes self-management. Estimated due date: 03/11/21; [redacted]w[redacted]d  Clinical: Medications: aspirin, prenatal, lovenox, vistaril, valtrex Medical History: no GDM Labs: OGTT 6/22 2hr H, A1c 5.5%   Dietary and Lifestyle History:   Physical Activity: Stress: Sleep:  24 hr Recall: First Meal: Snack: Second meal: Snack: Third meal: Snack: Beverages:  NUTRITION INTERVENTION  Nutrition education (E-1) on the following topics:   Initial Follow-up  [x]  []  Definition of Gestational Diabetes [x]  []  Why dietary management is important in controlling blood glucose [x]  []  Effects each nutrient has on blood glucose levels []  []  Simple carbohydrates vs complex carbohydrates []  []  Fluid intake [x]  []  Creating a balanced meal plan [x]  []  Carbohydrate counting  [x]  []  When to check blood glucose levels [x]  []  Proper blood glucose monitoring techniques [x]  []  Effect of stress and stress reduction techniques  [x]  []  Exercise effect on blood glucose levels, appropriate exercise during pregnancy [x]  []  Importance of limiting caffeine and abstaining from alcohol and smoking [x]  []  Medications used for blood sugar control during pregnancy [x]  []  Hypoglycemia and rule of 15 [x]  []  Postpartum self care  Blood glucose monitor given: Accu-chek guide Lot Exp 10/27/21 CBG: 148 mg/dL (tator tots and lemonade for lunch)  patient's insurance covers Freestyle lite meter but sample is not available.  Patient instructed to monitor glucose levels: FBS: 60 - ? 95 mg/dL (some clinics use 90 for cutoff) 1 hour: ? 140 mg/dL 2 hour: ? mg/dL  Patient received handouts: Nutrition Diabetes and Pregnancy Carbohydrate Counting List  Patient will be seen for follow-up as needed.

## 2020-12-21 NOTE — Progress Notes (Signed)
Glucometer and test strips needed to be reordered.

## 2020-12-22 ENCOUNTER — Other Ambulatory Visit (HOSPITAL_COMMUNITY): Payer: Self-pay

## 2020-12-22 ENCOUNTER — Telehealth: Payer: Self-pay

## 2020-12-22 DIAGNOSIS — O99119 Other diseases of the blood and blood-forming organs and certain disorders involving the immune mechanism complicating pregnancy, unspecified trimester: Secondary | ICD-10-CM

## 2020-12-22 DIAGNOSIS — F419 Anxiety disorder, unspecified: Secondary | ICD-10-CM

## 2020-12-22 MED ORDER — ENOXAPARIN SODIUM 40 MG/0.4ML IJ SOSY
40.0000 mg | PREFILLED_SYRINGE | Freq: Every day | INTRAMUSCULAR | 2 refills | Status: DC
  Filled 2020-12-22: qty 12, 30d supply, fill #0
  Filled 2021-01-18: qty 12, 30d supply, fill #1
  Filled 2021-03-01: qty 12, 30d supply, fill #2

## 2020-12-22 MED ORDER — HYDROXYZINE PAMOATE 25 MG PO CAPS
25.0000 mg | ORAL_CAPSULE | Freq: Three times a day (TID) | ORAL | 3 refills | Status: DC | PRN
  Filled 2020-12-22: qty 30, 10d supply, fill #0
  Filled 2021-01-29: qty 30, 10d supply, fill #1
  Filled 2021-02-13: qty 30, 10d supply, fill #2
  Filled 2021-02-14: qty 60, 20d supply, fill #2
  Filled 2021-02-14: qty 30, 10d supply, fill #2

## 2020-12-22 NOTE — Telephone Encounter (Signed)
Patient called in wanting to let provider know, she had an appt with diabetes education and is very upset with the visit because she feels as if she was brushed off and not educated on what she needed to know as far as what is going on and what she needs to know with the do's and don'ts. As well as she felt very uncomfortable about beng told "this office doesn't deal with private insurance and she wouldn't get her meter for a while because we're not use to dealing with private insurance". She was giving 10 test strips with her sample meter and will be out before Sunday using those. I advised patient she could go to pharmacy to purchase some strips until the provider or nurse could speak with her about the next steps. Patient is very upset and wanted to make sure someone got back to her and to let us know that she didn't appreciate the way she treated or was "educated yesterday and going home still uneducated on what she should be doing and how she is to take care of herself"    Please call and advise

## 2020-12-22 NOTE — Telephone Encounter (Signed)
I called Misty Russell and left a message I was calling her back to discuss her issues and sorry that I  missed you. Since we are closing soon will not be able to follow up with you until next week, and closed Monday due to [place]. Please follow up with [place] Tuesday. Will leave in in box for someone to call her again next week. Jacy Brocker,RN

## 2020-12-22 NOTE — Telephone Encounter (Signed)
Returned Jamila's call. She was calmer but still unhappy about her diabetes education visit yesterday, verbalizing that she felt rushed, her questions were not adequately answered and that the educator did not provide appropriate guidelines for eating or testing. She was also told it would take a long time for her testing supplies to come in since she has private insurance vs Medicaid.  Reassured pt that I sent in script to [facility] last night at 5:30pm and she confirmed she received an alert a few minutes ago saying it was ready.  Reviewed how to use glucometer, when and how to test.  Performed a dietary recall and encouraged her to switch out daily soda consumption for sparkling water, and to begin avoiding simple carbs. Brainstormed ways to increase protein consumption, healthy carb swaps and ways she can increase good fats. Emphasized importance of regular, balanced meals with a bedtime snack that is not too high in fats to avoid increasing her fasting glucose.  Reassured pt that this first week is mainly to try out the new eating habits and identify triggers that cause glucose spikes. We will review at her next visit and make adjustments as needed.  Pt does not desire to repeat her education appt, but will reach out to her providers and clinical dieticians at work if she has questions.  Answered all questions, pt verbalized feeling much better and will begin testing tomorrow.  Edd Arbour, CNM, MSN, IBCLC Certified Nurse Midwife, [facility]

## 2020-12-26 DIAGNOSIS — O24419 Gestational diabetes mellitus in pregnancy, unspecified control: Secondary | ICD-10-CM | POA: Insufficient documentation

## 2020-12-27 ENCOUNTER — Ambulatory Visit (INDEPENDENT_AMBULATORY_CARE_PROVIDER_SITE_OTHER): Payer: No Typology Code available for payment source | Admitting: Certified Nurse Midwife

## 2020-12-27 ENCOUNTER — Other Ambulatory Visit: Payer: Self-pay

## 2020-12-27 VITALS — BP 116/73 | HR 97 | Wt 165.8 lb

## 2020-12-27 DIAGNOSIS — Z3A29 29 weeks gestation of pregnancy: Secondary | ICD-10-CM

## 2020-12-27 DIAGNOSIS — D6861 Antiphospholipid syndrome: Secondary | ICD-10-CM

## 2020-12-27 DIAGNOSIS — O09523 Supervision of elderly multigravida, third trimester: Secondary | ICD-10-CM

## 2020-12-27 DIAGNOSIS — O0993 Supervision of high risk pregnancy, unspecified, third trimester: Secondary | ICD-10-CM

## 2020-12-27 DIAGNOSIS — O24410 Gestational diabetes mellitus in pregnancy, diet controlled: Secondary | ICD-10-CM

## 2020-12-27 DIAGNOSIS — O99119 Other diseases of the blood and blood-forming organs and certain disorders involving the immune mechanism complicating pregnancy, unspecified trimester: Secondary | ICD-10-CM

## 2020-12-29 ENCOUNTER — Other Ambulatory Visit: Payer: Self-pay | Admitting: *Deleted

## 2020-12-29 ENCOUNTER — Other Ambulatory Visit (HOSPITAL_COMMUNITY): Payer: Self-pay

## 2020-12-29 ENCOUNTER — Encounter: Payer: Self-pay | Admitting: *Deleted

## 2020-12-29 NOTE — Progress Notes (Signed)
   PRENATAL VISIT NOTE  Subjective:  Misty Russell is a 40 y.o. [id number] at [redacted]w[redacted]d being seen today for ongoing prenatal care.  She is currently monitored for the following issues for this high-risk pregnancy and has Adjustment disorder with anxious mood; Anxiety; Anticoagulation in pregnancy; Supervision of high risk pregnancy, antepartum; History of latent syphilis; AMA (advanced maternal age) multigravida 35+, second trimester; and Gestational diabetes mellitus (GDM), antepartum on their problem list.  Patient reports no complaints.  Contractions: Not present. Vag. Bleeding: None.  Movement: Present. Denies leaking of fluid.   The following portions of the patient's history were reviewed and updated as appropriate: allergies, current medications, past family history, past medical history, past social history, past surgical history and problem list.   Objective:   Vitals:   12/27/20 1437  BP: 116/73  Pulse: 97  Weight: 165 lb 12.8 oz (75.2 kg)    Fetal Status:   Fundal Height: 30 cm Movement: Present     General:  Alert, oriented and cooperative. Patient is in no acute distress.  Skin: Skin is warm and dry. No rash noted.   Cardiovascular: Normal heart rate noted  Respiratory: Normal respiratory effort, no problems with respiration noted  Abdomen: Soft, gravid, appropriate for gestational age.  Pain/Pressure: Absent     Pelvic: Cervical exam deferred        Extremities: Normal range of motion.  Edema: Trace  Mental Status: Normal mood and affect. Normal behavior. Normal judgment and thought content.   Assessment and Plan:  Pregnancy: Y1P5093 at [redacted]w[redacted]d 1. Supervision of high risk pregnancy in third trimester - Doing well, feeling regular and vigorous fetal movement  2. Advanced maternal age in multigravida, third trimester - [place] MFM OB FOLLOW UP; Future  3. [redacted] weeks gestation of pregnancy - Routine OB care  4. Diet controlled gestational diabetes mellitus (GDM) in third  trimester - Reviewed first week of glucose monitoring - 3 mildly postprandial out of range, 1 high fasting (116 after sugary cereal at bedtime the night before). Pt aware of what caused spikes (all due to too many carbs in one sitting, pt adjusting diet).  5. Antiphospholipid antibody syndrome complicating pregnancy (HCC) - Confirmed that pt will stop aspirin at 36wks and Lovenox 24hrs prior to her IOL (hopefully at 39wks if glucose diet controlled)  Preterm labor symptoms and general obstetric precautions including but not limited to vaginal bleeding, contractions, leaking of fluid and fetal movement were reviewed in detail with the patient. Please refer to After Visit Summary for other counseling recommendations.   Return in about 2 weeks (around 01/10/2021) for IN-PERSON, HROB.  Future Appointments  Date Time Provider Department Center  01/15/2021  8:15 AM Reva Bores, MD [facility]  01/26/2021  3:30 PM [facility] None  01/26/2021  4:00 PM Malachy Mood, MD [facility] None   Bernerd Limbo, CNM

## 2020-12-29 NOTE — Patient Outreach (Signed)
[facility]  [facility] Care Manager  12/29/2020   Misty Russell 11/09/1980 [id number]   Telephone Assessment  Diabetes during pregnancy - Gestational Diabetes   Referral received : 12/27/20 Initial outreach : 12/29/20 Insurance: [place] Focus Plan   Subjective:  Successful outreach call to Misty Russell explained reason for the call and [facility] care management gestational diabetes program. [name] reports doing okay on today. She discussed blood sugar reading today after eating a doughnut being at 150. She discussed this being a higher reading than she usually gets and understands probably related to highcalorie/carbohydrate treat.  She has supplies on hand for monitoring diabetes and understands frequency and goal ranges.   Objective:   Encounter Medications:  Outpatient Encounter Medications as of 12/29/2020  Medication Sig   ASPIRIN 81 PO Take by mouth.   Blood Glucose Monitoring Suppl (FREESTYLE LITE) w/Device KIT Use 4 times daily   busPIRone (BUSPAR) 5 MG tablet TAKE 1 TABLET BY MOUTH ONCE DAILY   enoxaparin (LOVENOX) 40 MG/0.4ML injection Inject 0.4 mLs (40 mg total) into the skin daily.   glucose blood test strip Use 4 times daily: immediately upon rising (fasting), then 2 hours after starting each meal.   hydrOXYzine (VISTARIL) 25 MG capsule Take 1 capsule (25 mg total) by mouth 3 (three) times daily as needed.   Lancets (FREESTYLE) lancets Use as instructed   Prenatal Vit-Fe Fumarate-FA (PRENATAL PO) Take by mouth.   valACYclovir (VALTREX) 500 MG tablet Take 1 tablet (500 mg total) by mouth daily.   [DISCONTINUED] Norethindrone Acetate-Ethinyl Estrad-FE (LOESTRIN 24 FE) 1-20 MG-MCG(24) tablet Take 1 tablet by mouth daily. (Patient not taking: Reported on 10/06/2019)   No facility-administered encounter medications on file as of 12/29/2020.    Functional Status:  In your present state of health, do you have any difficulty performing the  following activities: 11/21/2020  Hearing? N  Vision? N  Difficulty concentrating or making decisions? N  Walking or climbing stairs? N  Dressing or bathing? N  Some recent data might be hidden    Fall/Depression Screening: Fall Risk  10/06/2019 12/14/2016  Falls in the past year? 0 No  Number falls in past yr: 0 -  Injury with Fall? 0 -  Follow up Falls evaluation completed -   PHQ 2/9 Scores 12/29/2020 12/27/2020 12/13/2020 11/29/2020 11/08/2020 08/16/2020 02/07/2020  PHQ - 2 Score 0 0 0 0 0 0 0  PHQ- 9 Score - 0 0 0 0 0 2  Exception Documentation - - - - - - -   SDOH Screenings   Alcohol Screen: Not on file  Depression (PHQ2-9): Low Risk    PHQ-2 Score: 0  Financial Resource Strain: Not on file  Food Insecurity: No Food Insecurity   Worried About Running Out of Food in the Last Year: Never true   Ran Out of Food in the Last Year: Never true  Housing: Not on file  Physical Activity: Not on file  Social Connections: Not on file  Stress: Not on file  Tobacco Use: Low Risk    Smoking Tobacco Use: Never   Smokeless Tobacco Use: Never  Transportation Needs: No Transportation Needs   Lack of Transportation (Medical): No   Lack of Transportation (Non-Medical): No    Assessment:   Care Plan Care Plan : General Plan of Care (Adult)  Updates made by Glover, Kimberly A, RN since 12/29/2020 12:00 AM     Problem: Health Promotion or Disease Self-Management (General Plan of   Care)   Priority: High  Onset Date: 12/29/2020     Goal: Self-Management Plan Developed over the next 90 days patient will demonstrate good understanding of managment of gestional diabetes as evidenced by delivery of healthy baby with no related fetal or maternal complication related to diabetes   Start Date: 12/29/2020  Expected End Date: 03/12/2021  Priority: High  Note:   Evidence-based guidance:  Review biopsychosocial determinants of health screens.  Determine level of modifiable health risk.  Assess level of  patient activation, level of readiness, importance and confidence to make changes.  Evoke change talk using open-ended questions, pros and cons, as well as looking forward.  Identify areas where behavior change may lead to improved health.  Partner with patient to develop a robust self-management plan that includes lifestyle factors, such as weight loss, exercise and healthy nutrition, as well as goals specific to disease risks.  Support patient and family/caregiver active participation in decision-making and self-management plan.  Implement additional goals and interventions based on identified risk factors to reduce health risk.  Facilitate advance care planning.  Review need for preventive screening based on age, sex, family history and health history.   Notes:     Task: Mutually Develop and Foster Achievement of Patient Goals   Note:   Care Management Activities:    - questions answered - resources needed to meet goals identified  Reviewed Diabetes during pregnancy program benefits.  Reviewed education : blood sugar monitoring frequency and goals , hypoglycemia signs symptoms, balanced meal planning    Notes:       Goals Addressed             This Visit's Progress    Monitor and Manage My Blood Sugar       Timeframe:  Long-Range Goal Priority:  High Start Date:    12/29/20                         Expected End Date:  03/11/21                     Follow Up Date 01/29/2021    - check blood sugar at prescribed times - check blood sugar if I feel it is too high or too low  Discuss baby script program for diabetes with obgyn   Why is this important?   Checking your blood sugar at home helps to keep it from getting very high or very low.  Writing the results in a diary or log helps the doctor know how to care for you.  Your blood sugar log should have the time, date and the results.  Also, write down the amount of insulin or other medicine that you take.  Other information,  like what you ate, exercise done and how you were feeling, will also be helpful.     Notes: Reviewed recent blood sugar readings this am 150 after eating a donut. Patient able to identify frequency of monitoring glucose and goal during pregnancy. Discussed plate method of diabetes control, portion control ,rule of 15, snack options.          Plan:  Follow-up: Patient agrees to Care Plan and Follow-up.Agreeable to email follow up on monthly basis regarding progress with gestational diabetes management.  Next visit within a month. Will send notification to Abagail Kitchens of patient enrollment in Diabetes during pregnancy  program.    Joylene Draft, RN, BSN  [place] Management Coordinator  [phone]- Mobile [phone]- Toll Free Main Office    

## 2021-01-01 IMAGING — CR DG CHEST 2V
2 series · 2 of 2 positions shown · non-contrast
Comparison: None.

CLINICAL DATA: Chest pain

EXAM:
CHEST - 2 VIEW

[w chest pa]
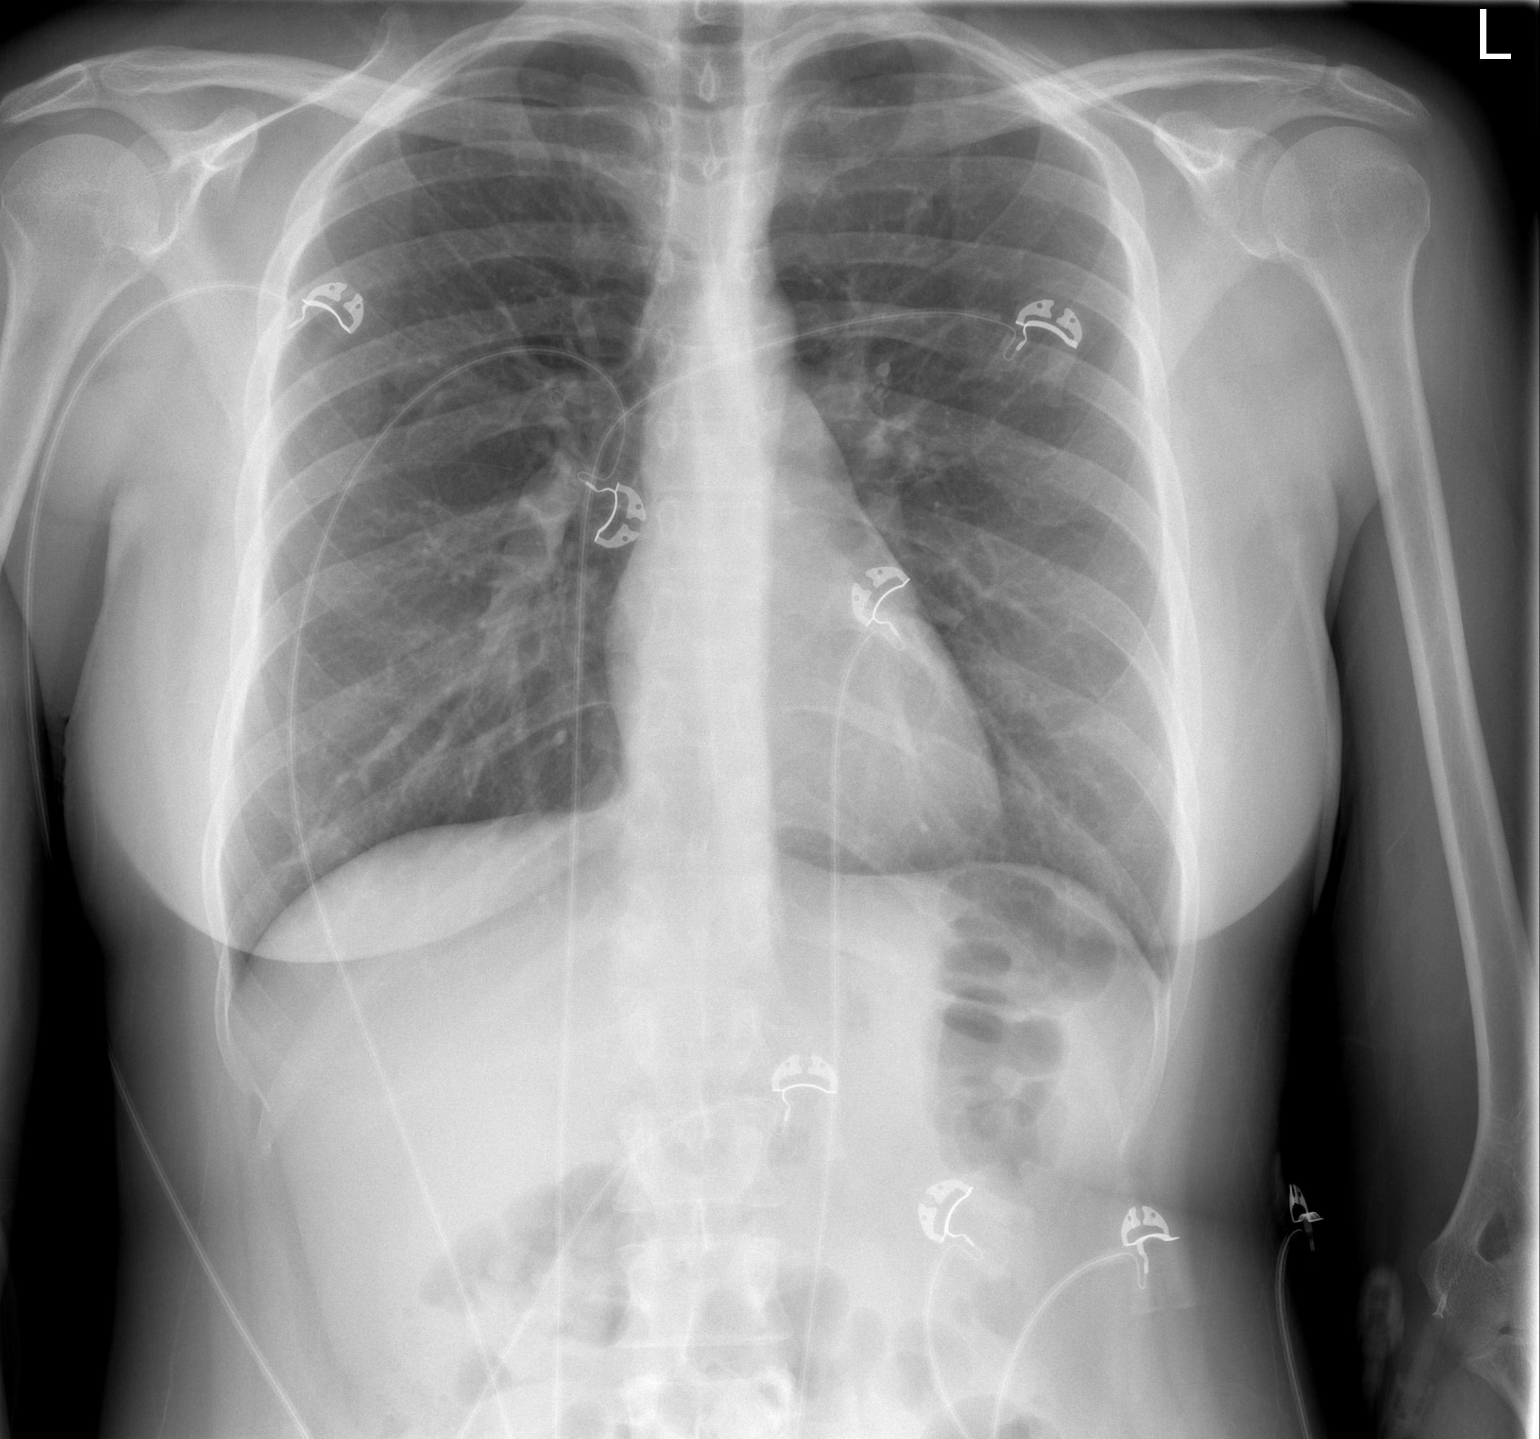

[w chest lat]
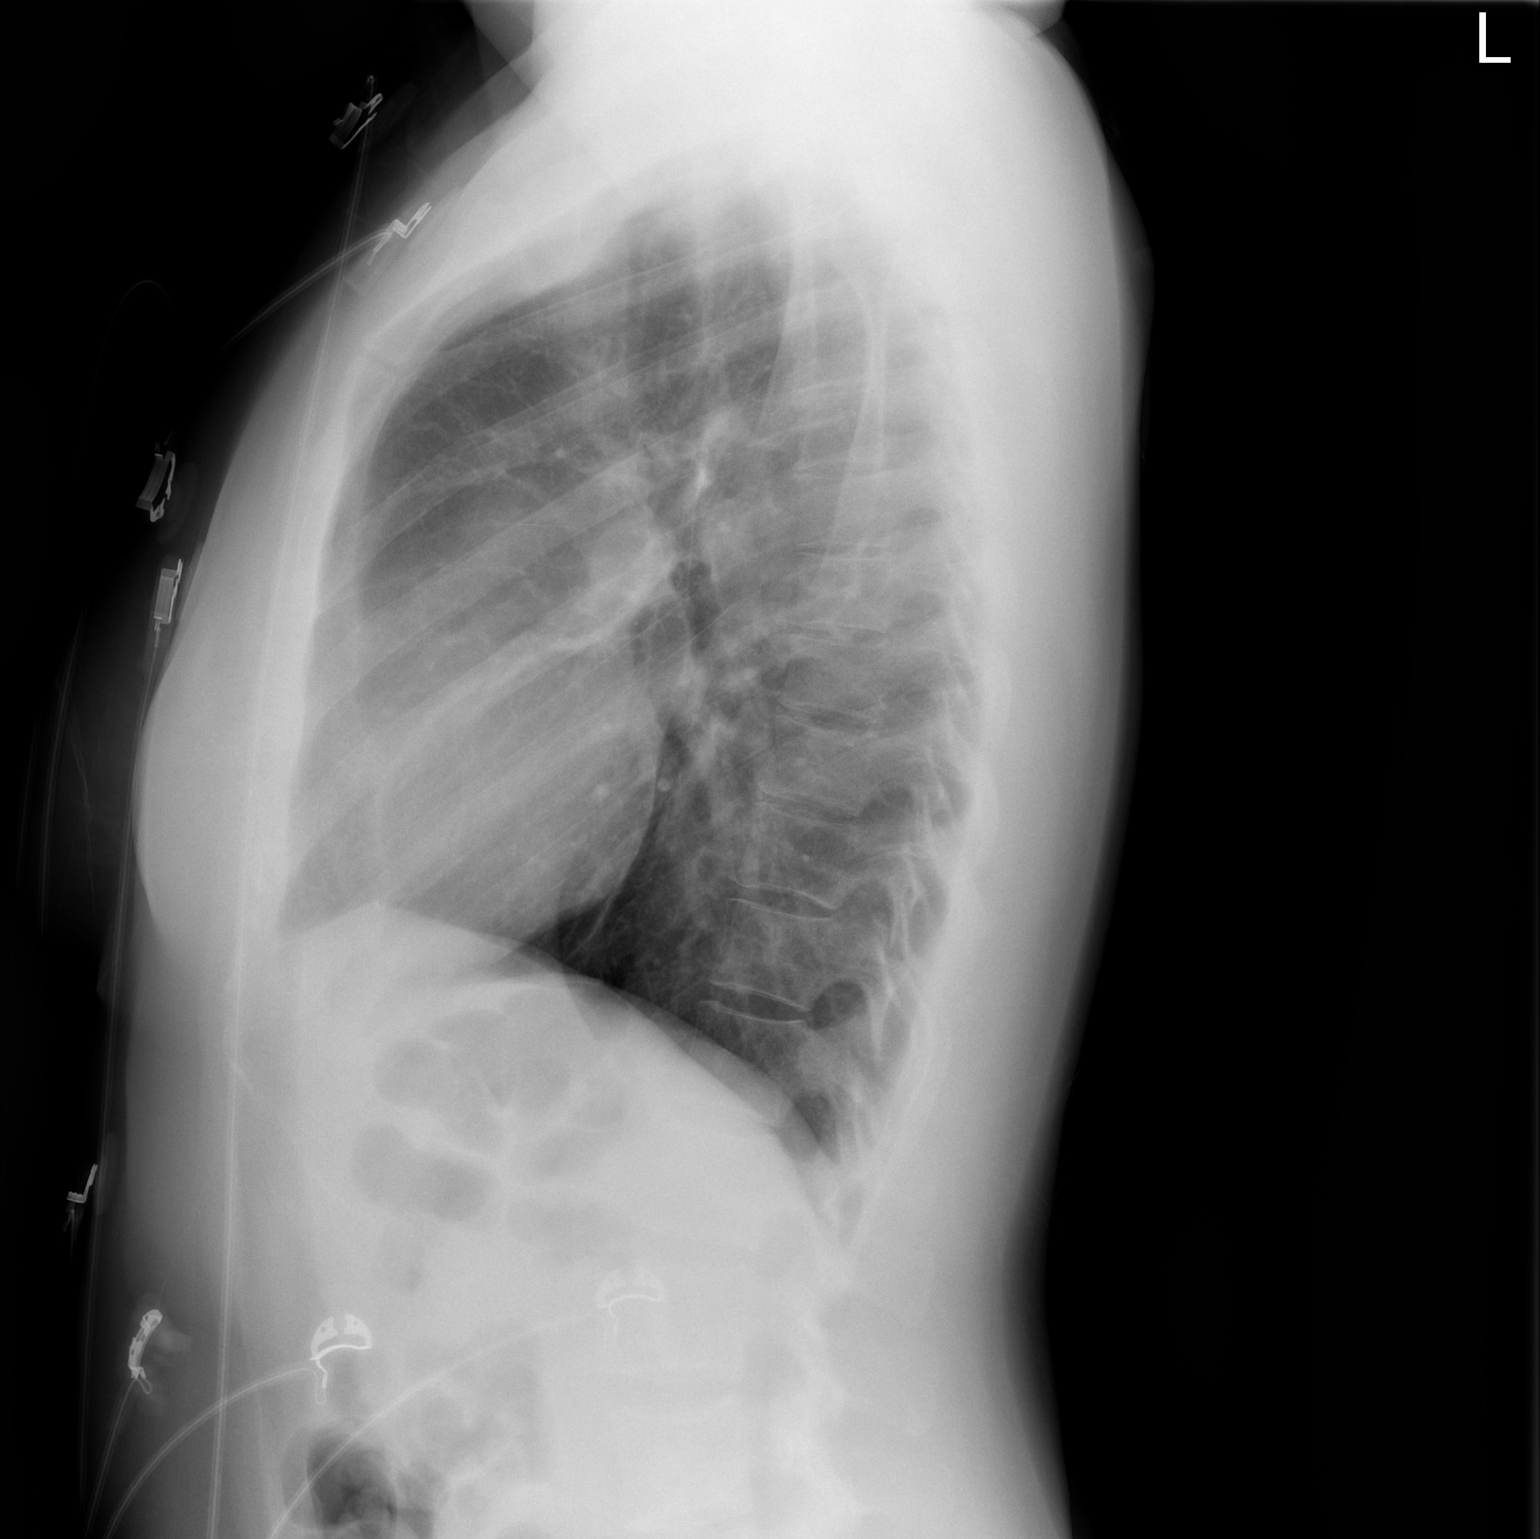

[2 of 2 positions shown; findings below may reference images not displayed]

FINDINGS: The heart size and mediastinal contours are within normal limits.
Both lungs are clear. The visualized skeletal structures are
unremarkable.
IMPRESSION: No active cardiopulmonary disease.

## 2021-01-04 ENCOUNTER — Other Ambulatory Visit: Payer: Self-pay

## 2021-01-04 ENCOUNTER — Ambulatory Visit (HOSPITAL_BASED_OUTPATIENT_CLINIC_OR_DEPARTMENT_OTHER): Payer: No Typology Code available for payment source | Admitting: Maternal & Fetal Medicine

## 2021-01-04 ENCOUNTER — Other Ambulatory Visit: Payer: Self-pay | Admitting: *Deleted

## 2021-01-04 ENCOUNTER — Ambulatory Visit: Payer: No Typology Code available for payment source | Attending: Certified Nurse Midwife

## 2021-01-04 ENCOUNTER — Ambulatory Visit: Payer: No Typology Code available for payment source | Admitting: *Deleted

## 2021-01-04 ENCOUNTER — Encounter: Payer: Self-pay | Admitting: *Deleted

## 2021-01-04 VITALS — BP 124/56 | HR 88

## 2021-01-04 DIAGNOSIS — O0990 Supervision of high risk pregnancy, unspecified, unspecified trimester: Secondary | ICD-10-CM | POA: Diagnosis present

## 2021-01-04 DIAGNOSIS — D6859 Other primary thrombophilia: Secondary | ICD-10-CM

## 2021-01-04 DIAGNOSIS — O24410 Gestational diabetes mellitus in pregnancy, diet controlled: Secondary | ICD-10-CM

## 2021-01-04 DIAGNOSIS — O09523 Supervision of elderly multigravida, third trimester: Secondary | ICD-10-CM | POA: Diagnosis not present

## 2021-01-04 DIAGNOSIS — O99113 Other diseases of the blood and blood-forming organs and certain disorders involving the immune mechanism complicating pregnancy, third trimester: Secondary | ICD-10-CM | POA: Insufficient documentation

## 2021-01-04 NOTE — Progress Notes (Signed)
MFM Brief Note  Ms. Lamos is a 40 yo G5P2 who is here for follow up growth due to new diagnosis of A1GDM.  Normal interval growth with measurements consistent with dates Good fetal movement and amniotic fluid volume   Ms. Coghill had a recent visit with overall normal blood sugars with a few abnormal values. Ms. Faro is aware of the changes she needs to make.   Secondly, she is being treated with Lovenox and aspirin for elevated Beta 2 glycoprotein on two occasions. We discussed that she should continue her lovenox until 39 weeks or be transitioned at 36 weeks to heparin and restart her therapy 4-6 hours after a vaginal delivery or 6-12 hours after a cesarean delivery until 6 weeks postpartum.  Follow up growth in 4 weeks given A2GDM  I spent 15 minutes with > 50% in face to face consultation.  [name], MD.

## 2021-01-15 ENCOUNTER — Ambulatory Visit (INDEPENDENT_AMBULATORY_CARE_PROVIDER_SITE_OTHER): Payer: No Typology Code available for payment source | Admitting: Family Medicine

## 2021-01-15 ENCOUNTER — Other Ambulatory Visit: Payer: Self-pay

## 2021-01-15 VITALS — BP 121/70 | HR 98 | Wt 162.6 lb

## 2021-01-15 DIAGNOSIS — Z23 Encounter for immunization: Secondary | ICD-10-CM | POA: Diagnosis not present

## 2021-01-15 DIAGNOSIS — O24410 Gestational diabetes mellitus in pregnancy, diet controlled: Secondary | ICD-10-CM

## 2021-01-15 DIAGNOSIS — O09522 Supervision of elderly multigravida, second trimester: Secondary | ICD-10-CM

## 2021-01-15 DIAGNOSIS — O0990 Supervision of high risk pregnancy, unspecified, unspecified trimester: Secondary | ICD-10-CM

## 2021-01-15 DIAGNOSIS — Z7901 Long term (current) use of anticoagulants: Secondary | ICD-10-CM

## 2021-01-15 NOTE — Progress Notes (Signed)
   PRENATAL VISIT NOTE  Subjective:  Misty Russell is a 40 y.o. [id number] at [redacted]w[redacted]d being seen today for ongoing prenatal care.  She is currently monitored for the following issues for this high-risk pregnancy and has Adjustment disorder with anxious mood; Anxiety; Anticoagulation in pregnancy; Supervision of high risk pregnancy, antepartum; History of latent syphilis; AMA (advanced maternal age) multigravida 35+, second trimester; and Gestational diabetes mellitus (GDM), antepartum on their problem list.  Patient reports no complaints.  Contractions: Irritability. Vag. Bleeding: None.  Movement: Present. Denies leaking of fluid.   The following portions of the patient's history were reviewed and updated as appropriate: allergies, current medications, past family history, past medical history, past social history, past surgical history and problem list.   Objective:   Vitals:   01/15/21 0826  BP: 121/70  Pulse: 98  Weight: 162 lb 9.6 oz (73.8 kg)    Fetal Status: Fetal Heart Rate (bpm): 145 Fundal Height: 33 cm Movement: Present     General:  Alert, oriented and cooperative. Patient is in no acute distress.  Skin: Skin is warm and dry. No rash noted.   Cardiovascular: Normal heart rate noted  Respiratory: Normal respiratory effort, no problems with respiration noted  Abdomen: Soft, gravid, appropriate for gestational age.  Pain/Pressure: Absent     Pelvic: Cervical exam deferred        Extremities: Normal range of motion.  Edema: None  Mental Status: Normal mood and affect. Normal behavior. Normal judgment and thought content.   Assessment and Plan:  Pregnancy: [id number] at [redacted]w[redacted]d 1. Diet controlled gestational diabetes mellitus (GDM), antepartum Most CBGs in range. Has few dietary indiscretions--will work on Normal growth and fluid    2. Supervision of high risk pregnancy, antepartum   3. AMA (advanced maternal age) multigravida 35+, second trimester Normal NIPT  Serial u/s  for growth  4. Anticoagulation in pregnancy On Lovenox  Preterm labor symptoms and general obstetric precautions including but not limited to vaginal bleeding, contractions, leaking of fluid and fetal movement were reviewed in detail with the patient. Please refer to After Visit Summary for other counseling recommendations.   Return in 2 weeks (on 01/29/2021) for [facility], needs MD.  Future Appointments  Date Time Provider Department Center  01/26/2021  3:30 PM [facility] None  01/26/2021  4:00 PM Malachy Mood, MD [facility] None  02/02/2021  2:15 PM [facility] NURSE [facility]  02/02/2021  2:30 PM [facility] US3 [facility]    Misty Bores, MD

## 2021-01-15 NOTE — Patient Instructions (Signed)

## 2021-01-16 ENCOUNTER — Other Ambulatory Visit (HOSPITAL_COMMUNITY): Payer: Self-pay

## 2021-01-18 ENCOUNTER — Other Ambulatory Visit (HOSPITAL_COMMUNITY): Payer: Self-pay

## 2021-01-26 ENCOUNTER — Other Ambulatory Visit: Payer: Self-pay

## 2021-01-26 ENCOUNTER — Inpatient Hospital Stay: Payer: No Typology Code available for payment source

## 2021-01-26 ENCOUNTER — Inpatient Hospital Stay: Payer: No Typology Code available for payment source | Attending: Hematology | Admitting: Hematology

## 2021-01-26 VITALS — BP 118/73 | HR 85 | Temp 98.6°F | Resp 17 | Ht 64.0 in | Wt 166.1 lb

## 2021-01-26 DIAGNOSIS — D649 Anemia, unspecified: Secondary | ICD-10-CM

## 2021-01-26 DIAGNOSIS — D6861 Antiphospholipid syndrome: Secondary | ICD-10-CM | POA: Insufficient documentation

## 2021-01-26 DIAGNOSIS — Z7901 Long term (current) use of anticoagulants: Secondary | ICD-10-CM | POA: Insufficient documentation

## 2021-01-26 DIAGNOSIS — N96 Recurrent pregnancy loss: Secondary | ICD-10-CM

## 2021-01-26 DIAGNOSIS — O99113 Other diseases of the blood and blood-forming organs and certain disorders involving the immune mechanism complicating pregnancy, third trimester: Secondary | ICD-10-CM | POA: Diagnosis present

## 2021-01-26 DIAGNOSIS — Z3A33 33 weeks gestation of pregnancy: Secondary | ICD-10-CM | POA: Insufficient documentation

## 2021-01-26 DIAGNOSIS — Z79899 Other long term (current) drug therapy: Secondary | ICD-10-CM | POA: Insufficient documentation

## 2021-01-26 LAB — CMP (CANCER CENTER ONLY)
ALT: 16 U/L (ref 0–44)
AST: 18 U/L (ref 15–41)
Albumin: 2.8 g/dL — ABNORMAL LOW (ref 3.5–5.0)
Alkaline Phosphatase: 154 U/L — ABNORMAL HIGH (ref 38–126)
Anion gap: 9 (ref 5–15)
BUN: 9 mg/dL (ref 6–20)
CO2: 20 mmol/L — ABNORMAL LOW (ref 22–32)
Calcium: 9.3 mg/dL (ref 8.9–10.3)
Chloride: 106 mmol/L (ref 98–111)
Creatinine: 0.83 mg/dL (ref 0.44–1.00)
GFR, Estimated: >60 mL/min (ref >60)
Glucose, Bld: 131 mg/dL — ABNORMAL HIGH (ref 70–99)
Potassium: 4.3 mmol/L (ref 3.5–5.1)
Sodium: 135 mmol/L (ref 135–145)
Total Bilirubin: 0.4 mg/dL (ref 0.3–1.2)
Total Protein: 6.9 g/dL (ref 6.5–8.1)

## 2021-01-26 LAB — CBC WITH DIFFERENTIAL (CANCER CENTER ONLY)
Abs Immature Granulocytes: 0.07 10*3/uL (ref 0.00–0.07)
Basophils Absolute: 0 10*3/uL (ref 0.0–0.1)
Basophils Relative: 0 %
Eosinophils Absolute: 0.2 10*3/uL (ref 0.0–0.5)
Eosinophils Relative: 2 %
HCT: 36.5 % (ref 36.0–46.0)
Hemoglobin: 12.3 g/dL (ref 12.0–15.0)
Immature Granulocytes: 1 %
Lymphocytes Relative: 25 %
Lymphs Abs: 2.4 10*3/uL (ref 0.7–4.0)
MCH: 29.1 pg (ref 26.0–34.0)
MCHC: 33.7 g/dL (ref 30.0–36.0)
MCV: 86.5 fL (ref 80.0–100.0)
Monocytes Absolute: 0.6 10*3/uL (ref 0.1–1.0)
Monocytes Relative: 6 %
Neutro Abs: 6.2 10*3/uL (ref 1.7–7.7)
Neutrophils Relative %: 66 %
Platelet Count: 296 10*3/uL (ref 150–400)
RBC: 4.22 MIL/uL (ref 3.87–5.11)
RDW: 13.6 % (ref 11.5–15.5)
WBC Count: 9.4 10*3/uL (ref 4.0–10.5)
nRBC: 0 % (ref 0.0–0.2)
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
## 2021-01-28 ENCOUNTER — Encounter: Payer: Self-pay | Admitting: Hematology

## 2021-01-29 ENCOUNTER — Other Ambulatory Visit (HOSPITAL_COMMUNITY): Payer: Self-pay

## 2021-01-29 LAB — FERRITIN: Ferritin: 22 ng/mL (ref 11–307)

## 2021-01-31 ENCOUNTER — Encounter: Payer: No Typology Code available for payment source | Admitting: Family Medicine

## 2021-02-01 ENCOUNTER — Ambulatory Visit: Payer: No Typology Code available for payment source

## 2021-02-02 ENCOUNTER — Ambulatory Visit: Payer: No Typology Code available for payment source | Admitting: *Deleted

## 2021-02-02 ENCOUNTER — Ambulatory Visit: Payer: No Typology Code available for payment source | Attending: Maternal & Fetal Medicine

## 2021-02-02 ENCOUNTER — Other Ambulatory Visit: Payer: Self-pay

## 2021-02-02 ENCOUNTER — Encounter: Payer: Self-pay | Admitting: *Deleted

## 2021-02-02 VITALS — BP 123/68 | HR 91

## 2021-02-02 DIAGNOSIS — O99113 Other diseases of the blood and blood-forming organs and certain disorders involving the immune mechanism complicating pregnancy, third trimester: Secondary | ICD-10-CM

## 2021-02-02 DIAGNOSIS — D6861 Antiphospholipid syndrome: Secondary | ICD-10-CM

## 2021-02-02 DIAGNOSIS — O98113 Syphilis complicating pregnancy, third trimester: Secondary | ICD-10-CM | POA: Diagnosis not present

## 2021-02-02 DIAGNOSIS — O0990 Supervision of high risk pregnancy, unspecified, unspecified trimester: Secondary | ICD-10-CM

## 2021-02-02 DIAGNOSIS — O24410 Gestational diabetes mellitus in pregnancy, diet controlled: Secondary | ICD-10-CM | POA: Diagnosis present

## 2021-02-02 DIAGNOSIS — Z3A34 34 weeks gestation of pregnancy: Secondary | ICD-10-CM

## 2021-02-02 DIAGNOSIS — A530 Latent syphilis, unspecified as early or late: Secondary | ICD-10-CM

## 2021-02-02 DIAGNOSIS — O09523 Supervision of elderly multigravida, third trimester: Secondary | ICD-10-CM

## 2021-02-02 DIAGNOSIS — Z362 Encounter for other antenatal screening follow-up: Secondary | ICD-10-CM

## 2021-02-05 ENCOUNTER — Other Ambulatory Visit: Payer: Self-pay | Admitting: *Deleted

## 2021-02-05 NOTE — Patient Outreach (Signed)
[facility]) Care Management  02/05/2021  Misty Russell 10/18/1980 [id number]   Diabetes During Pregnancy    Diabetes during pregnancy - Gestational Diabetes    Referral received : 12/27/20 Initial outreach : 12/29/20 Insurance: [place] Focus Plan   Subjective: Secure email message to patient  on 02/02/21 regarding follow up diabetes management during pregnancy   Received return email message on 02/05/21: I will forward my numbers thus far, going pretty well, my biggest issue is that we are on the go a lot and so I struggle sometimes to make the best food choices at restaurants but I recognize this and am working on it. I haven't received anything as it relates to EMMI diabetes education I don't believe.   Reviewed patient log of blood sugar readings, consistent with monitoring , fasting am blood sugar within goal of less than 95, and improvement in 1-2 hours post meal checks over the last week with goal range.   Patient reports not receiving EMMI education series on gestational diabetes from last month, will resend.    Goals Addressed             This Visit's Progress    Monitor and Manage My Blood Sugar       Timeframe:  Long-Range Goal Priority:  High Start Date:    12/29/20                         Expected End Date:  03/11/21                     Follow Up Date 01/29/2021  Barriers: Knowledge    - check blood sugar at prescribed times - check blood sugar if I feel it is too high or too low  Discuss baby script program for diabetes with obgyn    Why is this important?   Checking your blood sugar at home helps to keep it from getting very high or very low.  Writing the results in a diary or log helps the doctor know how to care for you.  Your blood sugar log should have the time, date and the results.  Also, write down the amount of insulin or other medicine that you take.  Other information, like what you ate, exercise done and how you were feeling,  will also be helpful.     Notes:02/05/21  Reviewed patient recent logs of blood sugar control, improvement with am fasting reading at goal, and communicated improvement in post 1 and 2 hour meals checks with identifying adjustments in meal, portion and how certain foods affect blood sugar readings.  Will resend [facility] education information patient reports not receiving.  Reviewed recent blood sugar readings this am 150 after eating a donut. Patient able to identify frequency of monitoring glucose and goal during pregnancy. Discussed plate method of diabetes control, portion control ,rule of 15, snack options. Encouraged review of EMMI and educational information on gestational diabetes.         Plan Will plan emaill follow up in the next month as communication during Diabetes during pregnancy program, encouraged patient to contact care coordinator sooner of new concern questions or resource information.    Egbert Garibaldi, RN, BSN  [facility] Care Management,Care Management Coordinator  [phone]- Mobile [phone]- Toll Free Main Office

## 2021-02-07 ENCOUNTER — Ambulatory Visit (INDEPENDENT_AMBULATORY_CARE_PROVIDER_SITE_OTHER): Payer: No Typology Code available for payment source | Admitting: Family Medicine

## 2021-02-07 ENCOUNTER — Other Ambulatory Visit (HOSPITAL_COMMUNITY)
Admission: RE | Admit: 2021-02-07 | Discharge: 2021-02-07 | Disposition: A | Discharge status: Home / Self Care | Payer: No Typology Code available for payment source | Source: Ambulatory Visit | Attending: Family Medicine | Admitting: Family Medicine

## 2021-02-07 ENCOUNTER — Other Ambulatory Visit: Payer: Self-pay

## 2021-02-07 ENCOUNTER — Telehealth: Payer: Self-pay

## 2021-02-07 VITALS — BP 122/78 | HR 89 | Wt 166.3 lb

## 2021-02-07 DIAGNOSIS — O0990 Supervision of high risk pregnancy, unspecified, unspecified trimester: Secondary | ICD-10-CM

## 2021-02-07 DIAGNOSIS — F419 Anxiety disorder, unspecified: Secondary | ICD-10-CM

## 2021-02-07 DIAGNOSIS — Z7901 Long term (current) use of anticoagulants: Secondary | ICD-10-CM

## 2021-02-07 DIAGNOSIS — O24410 Gestational diabetes mellitus in pregnancy, diet controlled: Secondary | ICD-10-CM

## 2021-02-07 DIAGNOSIS — F4322 Adjustment disorder with anxiety: Secondary | ICD-10-CM

## 2021-02-07 DIAGNOSIS — O09522 Supervision of elderly multigravida, second trimester: Secondary | ICD-10-CM

## 2021-02-07 DIAGNOSIS — Z3A35 35 weeks gestation of pregnancy: Secondary | ICD-10-CM

## 2021-02-07 NOTE — Patient Instructions (Signed)

## 2021-02-07 NOTE — Telephone Encounter (Signed)
This nurse called and spoke with patient and made aware we will continue her lovenox 40mg  daily until close to delivery (Dr. or Dr. Shawnie Pons will decide), no heparin bridging, and will restart lovenox after delivery. Again, she wishes vaginal delivery.  Patient acknowledges understanding and is in agreement.  No further questions or concerns at this time.

## 2021-02-07 NOTE — Progress Notes (Signed)
   PRENATAL VISIT NOTE  Subjective:  Misty Russell is a 40 y.o. [id number] at [redacted]w[redacted]d being seen today for ongoing prenatal care.  She is currently monitored for the following issues for this high-risk pregnancy and has Adjustment disorder with anxious mood; Anxiety; Anticoagulation in pregnancy; Supervision of high risk pregnancy, antepartum; History of latent syphilis; AMA (advanced maternal age) multigravida 35+, second trimester; and Gestational diabetes mellitus (GDM), antepartum on their problem list.  Patient reports  anxiety .  Contractions: Not present. Vag. Bleeding: None.  Movement: Present. Denies leaking of fluid.   The following portions of the patient's history were reviewed and updated as appropriate: allergies, current medications, past family history, past medical history, past social history, past surgical history and problem list.   Objective:   Vitals:   02/07/21 1329  BP: 122/78  Pulse: 89  Weight: 166 lb 4.8 oz (75.4 kg)    Fetal Status: Fetal Heart Rate (bpm): 135 Fundal Height: 33 cm Movement: Present  Presentation: Homero Fellers Breech  General:  Alert, oriented and cooperative. Patient is in no acute distress.  Skin: Skin is warm and dry. No rash noted.   Cardiovascular: Normal heart rate noted  Respiratory: Normal respiratory effort, no problems with respiration noted  Abdomen: Soft, gravid, appropriate for gestational age.  Pain/Pressure: Absent     Pelvic: Cervical exam performed in the presence of a chaperone Dilation: Closed Effacement (%): 30 Station: Ballotable  Extremities: Normal range of motion.  Edema: Trace  Mental Status: Normal mood and affect. Normal behavior. Normal judgment and thought content.   Assessment and Plan:  Pregnancy: [id number] at [redacted]w[redacted]d 1. Diet controlled gestational diabetes mellitus (GDM), antepartum CBGs are well controlled    2. AMA (advanced maternal age) multigravida 35+, second trimester Low risk NIPT  3. Anticoagulation in  pregnancy Discussed stopping prior to delivery for 24 hours  4. Supervision of high risk pregnancy, antepartum Cultures today, was breech, discussed ways to get baby to move. Possible ECV and/or attempt at Vaginal breech delivery. - Strep Gp B NAA - Cytology - PAP( [facility])  5. Anxiety Doing well on Buspar and Vistaril.    Preterm labor symptoms and general obstetric precautions including but not limited to vaginal bleeding, contractions, leaking of fluid and fetal movement were reviewed in detail with the patient. Please refer to After Visit Summary for other counseling recommendations.   Return in 1 week (on 02/14/2021) for [facility], needs MD.  Future Appointments  Date Time Provider Department Center  02/14/2021 10:55 AM Reva Bores, MD [facility]  05/02/2021  1:00 PM Malachy Mood, MD [facility] None    Reva Bores, MD

## 2021-02-08 LAB — CYTOLOGY - PAP
Chlamydia: NEGATIVE
Comment: NEGATIVE
Comment: NEGATIVE
Comment: NORMAL
Diagnosis: NEGATIVE
High risk HPV: NEGATIVE
Neisseria Gonorrhea: NEGATIVE

## 2021-02-09 LAB — STREP GP B NAA: Strep Gp B NAA: POSITIVE — AB

## 2021-02-11 ENCOUNTER — Encounter: Payer: Self-pay | Admitting: Family Medicine

## 2021-02-11 DIAGNOSIS — O99820 Streptococcus B carrier state complicating pregnancy: Secondary | ICD-10-CM

## 2021-02-11 HISTORY — DX: Streptococcus B carrier state complicating pregnancy: O99.820

## 2021-02-13 MED FILL — Buspirone HCl Tab 5 MG: ORAL | 90 days supply | Qty: 90 | Fill #1 | Status: AC

## 2021-02-14 ENCOUNTER — Other Ambulatory Visit: Payer: Self-pay | Admitting: Certified Nurse Midwife

## 2021-02-14 ENCOUNTER — Ambulatory Visit (INDEPENDENT_AMBULATORY_CARE_PROVIDER_SITE_OTHER): Payer: No Typology Code available for payment source | Admitting: Family Medicine

## 2021-02-14 ENCOUNTER — Other Ambulatory Visit: Payer: Self-pay | Admitting: Family Medicine

## 2021-02-14 ENCOUNTER — Other Ambulatory Visit (HOSPITAL_COMMUNITY): Payer: Self-pay

## 2021-02-14 ENCOUNTER — Other Ambulatory Visit: Payer: Self-pay

## 2021-02-14 VITALS — BP 124/73 | HR 94 | Wt 168.1 lb

## 2021-02-14 DIAGNOSIS — O321XX1 Maternal care for breech presentation, fetus 1: Secondary | ICD-10-CM

## 2021-02-14 DIAGNOSIS — B009 Herpesviral infection, unspecified: Secondary | ICD-10-CM

## 2021-02-14 DIAGNOSIS — Z7901 Long term (current) use of anticoagulants: Secondary | ICD-10-CM

## 2021-02-14 DIAGNOSIS — F419 Anxiety disorder, unspecified: Secondary | ICD-10-CM

## 2021-02-14 DIAGNOSIS — O09522 Supervision of elderly multigravida, second trimester: Secondary | ICD-10-CM

## 2021-02-14 DIAGNOSIS — O0990 Supervision of high risk pregnancy, unspecified, unspecified trimester: Secondary | ICD-10-CM

## 2021-02-14 DIAGNOSIS — O99820 Streptococcus B carrier state complicating pregnancy: Secondary | ICD-10-CM

## 2021-02-14 DIAGNOSIS — O24410 Gestational diabetes mellitus in pregnancy, diet controlled: Secondary | ICD-10-CM

## 2021-02-14 HISTORY — DX: Maternal care for breech presentation, fetus 1: O32.1XX1

## 2021-02-14 MED ORDER — VALACYCLOVIR HCL 500 MG PO TABS
500.0000 mg | ORAL_TABLET | Freq: Every day | ORAL | 0 refills | Status: DC
  Filled 2021-02-14: qty 90, 90d supply, fill #0

## 2021-02-14 NOTE — Progress Notes (Signed)
   PRENATAL VISIT NOTE  Subjective:  Misty Russell is a 40 y.o. [id number] at [redacted]w[redacted]d being seen today for ongoing prenatal care.  She is currently monitored for the following issues for this high-risk pregnancy and has Adjustment disorder with anxious mood; Anxiety; Anticoagulation in pregnancy; Supervision of high risk pregnancy, antepartum; History of latent syphilis; AMA (advanced maternal age) multigravida 35+, second trimester; Gestational diabetes mellitus (GDM), antepartum; and Group B Streptococcus carrier, +RV culture, currently pregnant on their problem list.  Patient reports no complaints.  Contractions: Not present.  .  Movement: Present. Denies leaking of fluid.   The following portions of the patient's history were reviewed and updated as appropriate: allergies, current medications, past family history, past medical history, past social history, past surgical history and problem list.   Objective:   Vitals:   02/14/21 1053  BP: 124/73  Pulse: 94  Weight: 168 lb 1.6 oz (76.2 kg)    Fetal Status: Fetal Heart Rate (bpm): 140 Fundal Height: 34 cm Movement: Present  Presentation: Homero Fellers Breech  General:  Alert, oriented and cooperative. Patient is in no acute distress.  Skin: Skin is warm and dry. No rash noted.   Cardiovascular: Normal heart rate noted  Respiratory: Normal respiratory effort, no problems with respiration noted  Abdomen: Soft, gravid, appropriate for gestational age.  Pain/Pressure: Absent     Pelvic: Cervical exam deferred        Extremities: Normal range of motion.  Edema: Trace  Mental Status: Normal mood and affect. Normal behavior. Normal judgment and thought content.   Assessment and Plan:  Pregnancy: [id number] at [redacted]w[redacted]d 1. Diet controlled gestational diabetes mellitus (GDM), antepartum CBGs are well controlled on diet    2. Supervision of high risk pregnancy, antepartum   3. Group B Streptococcus carrier, +RV culture, currently pregnant Will need  treatment in labor  4. AMA (advanced maternal age) multigravida 35+, second trimester Normal NIPT  5. Anxiety On Buspar and Vistaril, working well  6. Anticoagulation in pregnancy Continue Lovenox  7. Breech presentation on examination, fetus 1 Concerned about the cost of ECV--still considering Ok for attempt at vaginal breech delivery.  Preterm labor symptoms and general obstetric precautions including but not limited to vaginal bleeding, contractions, leaking of fluid and fetal movement were reviewed in detail with the patient. Please refer to After Visit Summary for other counseling recommendations.   Return in 1 week (on 02/21/2021).  Future Appointments  Date Time Provider Department Center  02/22/2021  3:35 PM Jacki Cones [facility]  05/02/2021  1:00 PM Malachy Mood, MD [facility] None    Reva Bores, MD

## 2021-02-14 NOTE — Progress Notes (Unsigned)
Valtrex refilled per pt request.

## 2021-02-14 NOTE — Patient Instructions (Signed)

## 2021-02-15 ENCOUNTER — Other Ambulatory Visit (HOSPITAL_COMMUNITY): Payer: Self-pay

## 2021-02-15 MED ORDER — HYDROXYZINE PAMOATE 25 MG PO CAPS
25.0000 mg | ORAL_CAPSULE | Freq: Three times a day (TID) | ORAL | 1 refills | Status: DC
  Filled 2021-02-15: qty 60, 20d supply, fill #0

## 2021-02-22 ENCOUNTER — Encounter: Payer: Self-pay | Admitting: Family Medicine

## 2021-02-22 ENCOUNTER — Ambulatory Visit (INDEPENDENT_AMBULATORY_CARE_PROVIDER_SITE_OTHER): Payer: No Typology Code available for payment source | Admitting: Family Medicine

## 2021-02-22 VITALS — BP 132/88 | HR 95 | Wt 171.8 lb

## 2021-02-22 DIAGNOSIS — O99820 Streptococcus B carrier state complicating pregnancy: Secondary | ICD-10-CM

## 2021-02-22 DIAGNOSIS — Z7901 Long term (current) use of anticoagulants: Secondary | ICD-10-CM

## 2021-02-22 DIAGNOSIS — O0990 Supervision of high risk pregnancy, unspecified, unspecified trimester: Secondary | ICD-10-CM

## 2021-02-22 DIAGNOSIS — O24410 Gestational diabetes mellitus in pregnancy, diet controlled: Secondary | ICD-10-CM

## 2021-02-22 DIAGNOSIS — O321XX1 Maternal care for breech presentation, fetus 1: Secondary | ICD-10-CM

## 2021-02-22 NOTE — Progress Notes (Signed)
    Subjective:  Misty Russell is a 40 y.o. [id number] at [redacted]w[redacted]d being seen today for ongoing prenatal care.  She is currently monitored for the following issues for this high-risk pregnancy and has Adjustment disorder with anxious mood; Anxiety; Anticoagulation in pregnancy; Supervision of high risk pregnancy, antepartum; History of latent syphilis; AMA (advanced maternal age) multigravida 35+, second trimester; Gestational diabetes mellitus (GDM), antepartum; Group B Streptococcus carrier, +RV culture, currently pregnant; and Breech presentation on examination, fetus 1 on their problem list.  Patient reports no complaints.  Contractions: Not present. Vag. Bleeding: None.  Movement: Present. Denies leaking of fluid.   The following portions of the patient's history were reviewed and updated as appropriate: allergies, current medications, past family history, past medical history, past social history, past surgical history and problem list.   Objective:   Vitals:   02/22/21 1605 02/22/21 1634  BP: 139/85 132/88  Pulse: 95   Weight: 171 lb 12.8 oz (77.9 kg)     Fetal Status: Fetal Heart Rate (bpm): 143 Fundal Height: 36 cm Movement: Present     General:  Alert, oriented and cooperative. Patient is in no acute distress.  Skin: Skin is warm and dry. No rash noted.   Cardiovascular: Normal heart rate noted  Respiratory: Normal respiratory effort, no problems with respiration noted  Abdomen: Soft, gravid, appropriate for gestational age. Pain/Pressure: Present     Pelvic:  Cervical exam deferred        Extremities: Normal range of motion.  Edema: Mild pitting, slight indentation  Mental Status: Normal mood and affect. Normal behavior. Normal judgment and thought content.    Assessment and Plan:  Pregnancy: [id number] at [redacted]w[redacted]d  1. Supervision of high risk pregnancy, antepartum Doing well. Cont to monitor BP at home.   2. Diet controlled gestational diabetes mellitus (GDM), antepartum CBGs  within range.   3. Anticoagulation in pregnancy Cont Lovenox, plan to d/c 24 hours prior to delivery.   4. Breech presentation on examination, fetus 1 Breech by Leopold's today. Discussed at length with patient, she is undecided about proceeding with vaginal breech delivery vs attempted ECV first. She will continue to discuss with her partner and let Dr. Shawnie Pons know.    5. Group B Streptococcus carrier, +RV culture, currently pregnant PCN during labor.   6. Anxiety  Very anxious today, provided reassurance. Cont vistaril and buspar.   Term labor symptoms and general obstetric precautions including but not limited to vaginal bleeding, contractions, leaking of fluid and fetal movement were reviewed in detail with the patient. Please refer to After Visit Summary for other counseling recommendations.   Return in about 1 week (around 03/01/2021) for  , HROB with dr Shawnie Pons.   Allayne Stack, DO

## 2021-02-22 NOTE — Patient Instructions (Addendum)
Monitor BP, goal <140/90. If persistently elevated, go to MAU.

## 2021-02-25 NOTE — Addendum Note (Signed)
Addended by: Reva Bores on: 02/25/2021 08:11 AM   Modules accepted: Orders, SmartSet

## 2021-02-26 ENCOUNTER — Other Ambulatory Visit: Payer: Self-pay | Admitting: Advanced Practice Midwife

## 2021-02-28 ENCOUNTER — Ambulatory Visit (INDEPENDENT_AMBULATORY_CARE_PROVIDER_SITE_OTHER): Payer: No Typology Code available for payment source | Admitting: Family Medicine

## 2021-02-28 ENCOUNTER — Other Ambulatory Visit: Payer: Self-pay

## 2021-02-28 VITALS — BP 133/86 | HR 89 | Wt 169.3 lb

## 2021-02-28 DIAGNOSIS — O24410 Gestational diabetes mellitus in pregnancy, diet controlled: Secondary | ICD-10-CM

## 2021-02-28 DIAGNOSIS — O99820 Streptococcus B carrier state complicating pregnancy: Secondary | ICD-10-CM

## 2021-02-28 DIAGNOSIS — O09522 Supervision of elderly multigravida, second trimester: Secondary | ICD-10-CM

## 2021-02-28 DIAGNOSIS — O321XX1 Maternal care for breech presentation, fetus 1: Secondary | ICD-10-CM

## 2021-02-28 DIAGNOSIS — O0990 Supervision of high risk pregnancy, unspecified, unspecified trimester: Secondary | ICD-10-CM

## 2021-02-28 NOTE — Progress Notes (Signed)
   PRENATAL VISIT NOTE  Subjective:  Misty Russell is a 40 y.o. [id number] at [redacted]w[redacted]d being seen today for ongoing prenatal care.  She is currently monitored for the following issues for this high-risk pregnancy and has Adjustment disorder with anxious mood; Anxiety; Anticoagulation in pregnancy; Supervision of high risk pregnancy, antepartum; History of latent syphilis; AMA (advanced maternal age) multigravida 35+, second trimester; Gestational diabetes mellitus (GDM), antepartum; Group B Streptococcus carrier, +RV culture, currently pregnant; and Breech presentation on examination, fetus 1 on their problem list.  Patient reports no complaints.  Contractions: Not present. Vag. Bleeding: None.  Movement: Present. Denies leaking of fluid.   The following portions of the patient's history were reviewed and updated as appropriate: allergies, current medications, past family history, past medical history, past social history, past surgical history and problem list.   Objective:   Vitals:   02/28/21 1606  BP: 133/86  Pulse: 89  Weight: 169 lb 4.8 oz (76.8 kg)    Fetal Status:     Movement: Present     General:  Alert, oriented and cooperative. Patient is in no acute distress.  Skin: Skin is warm and dry. No rash noted.   Cardiovascular: Normal heart rate noted  Respiratory: Normal respiratory effort, no problems with respiration noted  Abdomen: Soft, gravid, appropriate for gestational age.  Pain/Pressure: Present     Pelvic: Cervical exam deferred        Extremities: Normal range of motion.  Edema: Mild pitting, slight indentation  Mental Status: Normal mood and affect. Normal behavior. Normal judgment and thought content.   Assessment and Plan:  Pregnancy: [id number] at [redacted]w[redacted]d 1. Diet controlled gestational diabetes mellitus (GDM), antepartum  Recent growth at 30%.  2. Supervision of high risk pregnancy, antepartum   3. Group B Streptococcus carrier, +RV culture, currently  pregnant Will need treatment in labor  4. Breech presentation on examination, fetus 1 Planning IOL with breech baby  5. AMA (advanced maternal age) multigravida 35+, second trimester Normal NIPT  Term labor symptoms and general obstetric precautions including but not limited to vaginal bleeding, contractions, leaking of fluid and fetal movement were reviewed in detail with the patient. Please refer to After Visit Summary for other counseling recommendations.   Return in 1 week (on 03/07/2021).  Future Appointments  Date Time Provider Department Center  03/07/2021  1:15 PM Reva Bores, MD [facility]  03/08/2021  6:30 AM [facility] SCHED ROOM [facility] None  05/02/2021  1:00 PM Malachy Mood, MD [facility] None    Reva Bores, MD

## 2021-02-28 NOTE — Patient Instructions (Signed)

## 2021-03-01 ENCOUNTER — Encounter (HOSPITAL_COMMUNITY): Payer: Self-pay

## 2021-03-01 ENCOUNTER — Other Ambulatory Visit (HOSPITAL_COMMUNITY): Payer: Self-pay

## 2021-03-01 ENCOUNTER — Telehealth (HOSPITAL_COMMUNITY): Payer: Self-pay | Admitting: *Deleted

## 2021-03-01 ENCOUNTER — Encounter (HOSPITAL_COMMUNITY): Payer: Self-pay | Admitting: *Deleted

## 2021-03-01 NOTE — Telephone Encounter (Signed)
Preadmission screen  

## 2021-03-05 ENCOUNTER — Encounter (HOSPITAL_COMMUNITY): Payer: Self-pay

## 2021-03-05 ENCOUNTER — Inpatient Hospital Stay (HOSPITAL_COMMUNITY)
Admission: AD | Admit: 2021-03-05 | Discharge: 2021-03-07 | DRG: 806 | Disposition: A | Discharge status: Home / Self Care | Payer: No Typology Code available for payment source | Attending: Family Medicine | Admitting: Family Medicine

## 2021-03-05 ENCOUNTER — Inpatient Hospital Stay (HOSPITAL_COMMUNITY): Payer: No Typology Code available for payment source | Admitting: Anesthesiology

## 2021-03-05 ENCOUNTER — Other Ambulatory Visit: Payer: Self-pay

## 2021-03-05 DIAGNOSIS — O24420 Gestational diabetes mellitus in childbirth, diet controlled: Secondary | ICD-10-CM | POA: Diagnosis present

## 2021-03-05 DIAGNOSIS — O9912 Other diseases of the blood and blood-forming organs and certain disorders involving the immune mechanism complicating childbirth: Secondary | ICD-10-CM | POA: Diagnosis present

## 2021-03-05 DIAGNOSIS — Z20822 Contact with and (suspected) exposure to covid-19: Secondary | ICD-10-CM | POA: Diagnosis present

## 2021-03-05 DIAGNOSIS — D689 Coagulation defect, unspecified: Secondary | ICD-10-CM | POA: Diagnosis present

## 2021-03-05 DIAGNOSIS — F4322 Adjustment disorder with anxiety: Secondary | ICD-10-CM | POA: Diagnosis present

## 2021-03-05 DIAGNOSIS — O24419 Gestational diabetes mellitus in pregnancy, unspecified control: Secondary | ICD-10-CM | POA: Diagnosis present

## 2021-03-05 DIAGNOSIS — Z3A39 39 weeks gestation of pregnancy: Secondary | ICD-10-CM

## 2021-03-05 DIAGNOSIS — O99824 Streptococcus B carrier state complicating childbirth: Secondary | ICD-10-CM | POA: Diagnosis present

## 2021-03-05 DIAGNOSIS — O26893 Other specified pregnancy related conditions, third trimester: Secondary | ICD-10-CM | POA: Diagnosis present

## 2021-03-05 DIAGNOSIS — O0990 Supervision of high risk pregnancy, unspecified, unspecified trimester: Secondary | ICD-10-CM

## 2021-03-05 DIAGNOSIS — Z7901 Long term (current) use of anticoagulants: Secondary | ICD-10-CM

## 2021-03-05 DIAGNOSIS — O4202 Full-term premature rupture of membranes, onset of labor within 24 hours of rupture: Secondary | ICD-10-CM

## 2021-03-05 DIAGNOSIS — O328XX0 Maternal care for other malpresentation of fetus, not applicable or unspecified: Secondary | ICD-10-CM

## 2021-03-05 DIAGNOSIS — O321XX1 Maternal care for breech presentation, fetus 1: Secondary | ICD-10-CM | POA: Diagnosis present

## 2021-03-05 DIAGNOSIS — O4292 Full-term premature rupture of membranes, unspecified as to length of time between rupture and onset of labor: Principal | ICD-10-CM | POA: Diagnosis present

## 2021-03-05 DIAGNOSIS — O99820 Streptococcus B carrier state complicating pregnancy: Secondary | ICD-10-CM

## 2021-03-05 LAB — CBC
HCT: 39.8 % (ref 36.0–46.0)
Hemoglobin: 13.3 g/dL (ref 12.0–15.0)
MCH: 29.6 pg (ref 26.0–34.0)
MCHC: 33.4 g/dL (ref 30.0–36.0)
MCV: 88.4 fL (ref 80.0–100.0)
Platelets: 290 10*3/uL (ref 150–400)
RBC: 4.5 MIL/uL (ref 3.87–5.11)
RDW: 14.6 % (ref 11.5–15.5)
WBC: 8.6 10*3/uL (ref 4.0–10.5)
nRBC: 0 % (ref 0.0–0.2)

## 2021-03-05 LAB — RESP PANEL BY RT-PCR (FLU A&B, COVID) ARPGX2
Influenza A by PCR: NEGATIVE
Influenza B by PCR: NEGATIVE
SARS Coronavirus 2 by RT PCR: NEGATIVE

## 2021-03-05 LAB — POCT FERN TEST: POCT Fern Test: POSITIVE

## 2021-03-05 LAB — GLUCOSE, CAPILLARY
Glucose-Capillary: 79 mg/dL (ref 70–99)
Glucose-Capillary: 87 mg/dL (ref 70–99)
Glucose-Capillary: 60 mg/dL — ABNORMAL LOW (ref 70–99)
Glucose-Capillary: 66 mg/dL — ABNORMAL LOW (ref 70–99)
Glucose-Capillary: 74 mg/dL (ref 70–99)

## 2021-03-05 LAB — RPR: RPR Ser Ql: NONREACTIVE

## 2021-03-05 LAB — TYPE AND SCREEN
ABO/RH(D): O POS
Antibody Screen: NEGATIVE

## 2021-03-05 MED ORDER — SIMETHICONE 80 MG PO CHEW: 80.0000 mg | CHEWABLE_TABLET | ORAL | Status: DC | PRN

## 2021-03-05 MED ORDER — DIPHENHYDRAMINE HCL 50 MG/ML IJ SOLN: 12.5000 mg | INTRAMUSCULAR | Status: DC | PRN

## 2021-03-05 MED ORDER — LACTATED RINGERS IV SOLN: 500.0000 mL | INTRAVENOUS | Status: DC | PRN

## 2021-03-05 MED ORDER — COCONUT OIL OIL
1.0000 "application " | TOPICAL_OIL | Status: DC | PRN
  Administered 2021-03-06: 1 via TOPICAL

## 2021-03-05 MED ORDER — FLEET ENEMA 7-19 GM/118ML RE ENEM: 1.0000 | ENEMA | RECTAL | Status: DC | PRN

## 2021-03-05 MED ORDER — DIPHENHYDRAMINE HCL 25 MG PO CAPS: 25.0000 mg | ORAL_CAPSULE | Freq: Four times a day (QID) | ORAL | Status: DC | PRN

## 2021-03-05 MED ORDER — SOD CITRATE-CITRIC ACID 500-334 MG/5ML PO SOLN: 30.0000 mL | ORAL | Status: DC | PRN

## 2021-03-05 MED ORDER — IBUPROFEN 600 MG PO TABS
600.0000 mg | ORAL_TABLET | Freq: Four times a day (QID) | ORAL | Status: DC
  Administered 2021-03-06 – 2021-03-07 (×7): 600 mg via ORAL
  Filled 2021-03-05 (×7): qty 1

## 2021-03-05 MED ORDER — OXYTOCIN BOLUS FROM INFUSION: 333.0000 mL | Freq: Once | INTRAVENOUS | Status: DC

## 2021-03-05 MED ORDER — HYDROXYZINE HCL 50 MG PO TABS
25.0000 mg | ORAL_TABLET | Freq: Once | ORAL | Status: AC
  Administered 2021-03-05: 25 mg via ORAL
  Filled 2021-03-05: qty 1

## 2021-03-05 MED ORDER — DIBUCAINE (PERIANAL) 1 % EX OINT: 1.0000 "application " | TOPICAL_OINTMENT | CUTANEOUS | Status: DC | PRN

## 2021-03-05 MED ORDER — EPHEDRINE 5 MG/ML INJ: 10.0000 mg | INTRAVENOUS | Status: DC | PRN

## 2021-03-05 MED ORDER — TERBUTALINE SULFATE 1 MG/ML IJ SOLN: 0.2500 mg | Freq: Once | INTRAMUSCULAR | Status: DC | PRN

## 2021-03-05 MED ORDER — PRENATAL MULTIVITAMIN CH
1.0000 | ORAL_TABLET | Freq: Every day | ORAL | Status: DC
  Administered 2021-03-06: 1 via ORAL
  Filled 2021-03-05: qty 1

## 2021-03-05 MED ORDER — OXYTOCIN-SODIUM CHLORIDE 30-0.9 UT/500ML-% IV SOLN
1.0000 m[IU]/min | INTRAVENOUS | Status: DC
  Administered 2021-03-05: 2 m[IU]/min via INTRAVENOUS
  Administered 2021-03-05: 4 m[IU]/min via INTRAVENOUS
  Filled 2021-03-05: qty 500

## 2021-03-05 MED ORDER — LIDOCAINE HCL (PF) 1 % IJ SOLN: 30.0000 mL | INTRAMUSCULAR | Status: DC | PRN

## 2021-03-05 MED ORDER — ACETAMINOPHEN 325 MG PO TABS
650.0000 mg | ORAL_TABLET | ORAL | Status: DC | PRN
  Administered 2021-03-06: 650 mg via ORAL
  Filled 2021-03-05: qty 2

## 2021-03-05 MED ORDER — MEASLES, MUMPS & RUBELLA VAC IJ SOLR: 0.5000 mL | Freq: Once | INTRAMUSCULAR | Status: DC

## 2021-03-05 MED ORDER — ENOXAPARIN SODIUM 40 MG/0.4ML IJ SOSY
40.0000 mg | PREFILLED_SYRINGE | Freq: Every day | INTRAMUSCULAR | Status: DC
  Administered 2021-03-06 – 2021-03-07 (×2): 40 mg via SUBCUTANEOUS
  Filled 2021-03-05 (×2): qty 0.4

## 2021-03-05 MED ORDER — ONDANSETRON HCL 4 MG PO TABS: 4.0000 mg | ORAL_TABLET | ORAL | Status: DC | PRN

## 2021-03-05 MED ORDER — PENICILLIN G POT IN DEXTROSE 60000 UNIT/ML IV SOLN
3.0000 10*6.[IU] | INTRAVENOUS | Status: DC
  Administered 2021-03-05 (×2): 3 10*6.[IU] via INTRAVENOUS
  Filled 2021-03-05 (×2): qty 50

## 2021-03-05 MED ORDER — PHENYLEPHRINE 40 MCG/ML (10ML) SYRINGE FOR IV PUSH (FOR BLOOD PRESSURE SUPPORT): 80.0000 ug | PREFILLED_SYRINGE | INTRAVENOUS | Status: DC | PRN

## 2021-03-05 MED ORDER — MEDROXYPROGESTERONE ACETATE 150 MG/ML IM SUSP: 150.0000 mg | INTRAMUSCULAR | Status: DC | PRN

## 2021-03-05 MED ORDER — FENTANYL-BUPIVACAINE-NACL 0.5-0.125-0.9 MG/250ML-% EP SOLN
12.0000 mL/h | EPIDURAL | Status: DC | PRN
  Filled 2021-03-05: qty 250

## 2021-03-05 MED ORDER — LACTATED RINGERS IV SOLN: INTRAVENOUS | Status: DC

## 2021-03-05 MED ORDER — WITCH HAZEL-GLYCERIN EX PADS: 1.0000 "application " | MEDICATED_PAD | CUTANEOUS | Status: DC | PRN

## 2021-03-05 MED ORDER — ERYTHROMYCIN 5 MG/GM OP OINT
TOPICAL_OINTMENT | OPHTHALMIC | Status: AC
  Filled 2021-03-05: qty 1

## 2021-03-05 MED ORDER — FENTANYL-BUPIVACAINE-NACL 0.5-0.125-0.9 MG/250ML-% EP SOLN
EPIDURAL | Status: DC | PRN
  Administered 2021-03-05: 12 mL/h via EPIDURAL

## 2021-03-05 MED ORDER — ACETAMINOPHEN 325 MG PO TABS
650.0000 mg | ORAL_TABLET | ORAL | Status: DC | PRN
  Administered 2021-03-05: 650 mg via ORAL
  Filled 2021-03-05: qty 2

## 2021-03-05 MED ORDER — TETANUS-DIPHTH-ACELL PERTUSSIS 5-2.5-18.5 LF-MCG/0.5 IM SUSY: 0.5000 mL | PREFILLED_SYRINGE | Freq: Once | INTRAMUSCULAR | Status: DC

## 2021-03-05 MED ORDER — FENTANYL CITRATE (PF) 100 MCG/2ML IJ SOLN
100.0000 ug | INTRAMUSCULAR | Status: DC | PRN
Start: 2021-03-05 — End: 2021-03-05
  Administered 2021-03-05: 100 ug via INTRAVENOUS
  Filled 2021-03-05: qty 2

## 2021-03-05 MED ORDER — ONDANSETRON HCL 4 MG/2ML IJ SOLN: 4.0000 mg | INTRAMUSCULAR | Status: DC | PRN

## 2021-03-05 MED ORDER — OXYCODONE-ACETAMINOPHEN 5-325 MG PO TABS: 1.0000 | ORAL_TABLET | ORAL | Status: DC | PRN

## 2021-03-05 MED ORDER — MISOPROSTOL 25 MCG QUARTER TABLET
25.0000 ug | ORAL_TABLET | ORAL | Status: DC | PRN
  Filled 2021-03-05: qty 1

## 2021-03-05 MED ORDER — OXYTOCIN-SODIUM CHLORIDE 30-0.9 UT/500ML-% IV SOLN: 2.5000 [IU]/h | INTRAVENOUS | Status: DC

## 2021-03-05 MED ORDER — MISOPROSTOL 50MCG HALF TABLET: 50.0000 ug | ORAL_TABLET | ORAL | Status: DC

## 2021-03-05 MED ORDER — SODIUM CHLORIDE 0.9 % IV SOLN
5.0000 10*6.[IU] | Freq: Once | INTRAVENOUS | Status: AC
  Administered 2021-03-05: 5 10*6.[IU] via INTRAVENOUS
  Filled 2021-03-05: qty 5

## 2021-03-05 MED ORDER — LIDOCAINE HCL (PF) 1 % IJ SOLN
INTRAMUSCULAR | Status: DC | PRN
  Administered 2021-03-05: 10 mL via EPIDURAL
  Administered 2021-03-05: 2 mL via EPIDURAL

## 2021-03-05 MED ORDER — LACTATED RINGERS IV SOLN: 500.0000 mL | Freq: Once | INTRAVENOUS | Status: DC

## 2021-03-05 MED ORDER — SENNOSIDES-DOCUSATE SODIUM 8.6-50 MG PO TABS
2.0000 | ORAL_TABLET | Freq: Every day | ORAL | Status: DC
  Administered 2021-03-06 – 2021-03-07 (×2): 2 via ORAL
  Filled 2021-03-05 (×2): qty 2

## 2021-03-05 MED ORDER — ONDANSETRON HCL 4 MG/2ML IJ SOLN
4.0000 mg | Freq: Four times a day (QID) | INTRAMUSCULAR | Status: DC | PRN
  Administered 2021-03-05 (×2): 4 mg via INTRAVENOUS
  Filled 2021-03-05 (×2): qty 2

## 2021-03-05 MED ORDER — BENZOCAINE-MENTHOL 20-0.5 % EX AERO
1.0000 "application " | INHALATION_SPRAY | CUTANEOUS | Status: DC | PRN
  Administered 2021-03-06: 1 via TOPICAL
  Filled 2021-03-05: qty 56

## 2021-03-05 MED ORDER — OXYCODONE-ACETAMINOPHEN 5-325 MG PO TABS: 2.0000 | ORAL_TABLET | ORAL | Status: DC | PRN

## 2021-03-05 NOTE — Anesthesia Procedure Notes (Signed)
Epidural Patient location during procedure: OB Start time: 03/05/2021 12:51 PM End time: 03/05/2021 1:02 PM  Staffing Anesthesiologist: Lannie Fields, DO Performed: anesthesiologist   Preanesthetic Checklist Completed: patient identified, IV checked, risks and benefits discussed, monitors and equipment checked, pre-op evaluation and timeout performed  Epidural Patient position: sitting Prep: DuraPrep and site prepped and draped Patient monitoring: continuous pulse ox, blood pressure, heart rate and cardiac monitor Approach: midline Location: L3-L4 Injection technique: LOR air  Needle:  Needle type: Tuohy  Needle gauge: 17 G Needle length: 9 cm Needle insertion depth: 5 cm Catheter type: closed end flexible Catheter size: 19 Gauge Catheter at skin depth: 10 cm Test dose: negative  Assessment Sensory level: T8 Events: blood not aspirated, injection not painful, no injection resistance, no paresthesia and negative IV test  Additional Notes Patient identified. Risks/Benefits/Options discussed with patient including but not limited to bleeding, infection, nerve damage, paralysis, failed block, incomplete pain control, headache, blood pressure changes, nausea, vomiting, reactions to medication both or allergic, itching and postpartum back pain. Confirmed with bedside nurse the patient's most recent platelet count. Confirmed with patient that they are not currently taking any anticoagulation, have any bleeding history or any family history of bleeding disorders. Patient expressed understanding and wished to proceed. All questions were answered. Sterile technique was used throughout the entire procedure. Please see nursing notes for vital signs. Test dose was given through epidural catheter and negative prior to continuing to dose epidural or start infusion. Warning signs of high block given to the patient including shortness of breath, tingling/numbness in hands, complete motor  block, or any concerning symptoms with instructions to call for help. Patient was given instructions on fall risk and not to get out of bed. All questions and concerns addressed with instructions to call with any issues or inadequate analgesia.  Reason for block:procedure for pain

## 2021-03-05 NOTE — MAU Note (Addendum)
Water broke at 4:05 am, clear fluid, slight touch of pink.  Baby active last night, felt some movement this morning. Mild cramping, pressure in low abd. GBS positive.  Had her Lovenox last night.  Scheduled for induction on Thursday, for a vaginal delivery - baby is breech - Dr Shawnie Pons would be delivering.

## 2021-03-05 NOTE — Anesthesia Preprocedure Evaluation (Signed)
Anesthesia Evaluation  Patient identified by MRN, date of birth, ID band Patient awake    Reviewed: Allergy & Precautions, Patient's Chart, lab work & pertinent test results  Airway Mallampati: II  TM Distance: >3 FB Neck ROM: Full    Dental no notable dental hx.    Pulmonary neg pulmonary ROS,    Pulmonary exam normal breath sounds clear to auscultation       Cardiovascular negative cardio ROS Normal cardiovascular exam Rhythm:Regular Rate:Normal     Neuro/Psych  Headaches, PSYCHIATRIC DISORDERS Anxiety    GI/Hepatic negative GI ROS, Neg liver ROS,   Endo/Other  diabetes, Gestational  Renal/GU negative Renal ROS  negative genitourinary   Musculoskeletal negative musculoskeletal ROS (+)   Abdominal   Peds negative pediatric ROS (+)  Hematology  (+) Blood dyscrasia, , Antiphospholipid on lovenox 40mg  qd- last dose 11PM 03/04/21 (14h ago) hct 39.8, plt 290   Anesthesia Other Findings   Reproductive/Obstetrics (+) Pregnancy Breech attempting vaginal delivery                              Anesthesia Physical Anesthesia Plan  ASA: 3  Anesthesia Plan: Epidural   Post-op Pain Management:    Induction:   PONV Risk Score and Plan: 2  Airway Management Planned: Natural Airway  Additional Equipment: None  Intra-op Plan:   Post-operative Plan:   Informed Consent: I have reviewed the patients History and Physical, chart, labs and discussed the procedure including the risks, benefits and alternatives for the proposed anesthesia with the patient or authorized representative who has indicated his/her understanding and acceptance.       Plan Discussed with:   Anesthesia Plan Comments:         Anesthesia Quick Evaluation

## 2021-03-05 NOTE — Progress Notes (Signed)
LABOR PROGRESS NOTE  Misty Russell is a 40 y.o. [id number] at [redacted]w[redacted]d  admitted for SROM.  Subjective: Patient feeling intermittent contractions  Objective: BP 127/88   Pulse 92   Temp 98 F (36.7 C) (Oral)   Resp 18   Ht 5\' 4"  (1.626 m)   Wt 76.7 kg   LMP 04/11/2020 (Approximate)   SpO2 97%   BMI 29.01 kg/m  or  Vitals:   03/05/21 0623 03/05/21 0643  BP: 130/81 127/88  Pulse: 88 92  Resp: 18   Temp: 98 F (36.7 C)   TempSrc: Oral   SpO2: 97%   Weight: 76.7 kg   Height: 5\' 4"  (1.626 m)      Dilation: 1.5 Effacement (%): Thick Cervical Position: Posterior Station: -3 Presentation: Complete Breech Exam by:: Dr. FHT: baseline rate 140, moderate varibility, +acel, no decel Toco: rare contractions  Labs: Lab Results  Component Value Date   WBC 8.6 03/05/2021   HGB 13.3 03/05/2021   HCT 39.8 03/05/2021   MCV 88.4 03/05/2021   PLT 290 03/05/2021    Patient Active Problem List   Diagnosis Date Noted   Gestational diabetes 03/05/2021   Breech presentation on examination, fetus 1 02/14/2021   Group B Streptococcus carrier, +RV culture, currently pregnant 02/11/2021   Gestational diabetes mellitus (GDM), antepartum 12/26/2020   AMA (advanced maternal age) multigravida 35+, second trimester 11/08/2020   History of latent syphilis 08/16/2020   Supervision of high risk pregnancy, antepartum 08/15/2020   Anticoagulation in pregnancy 07/31/2020   Anxiety 11/19/2019   Adjustment disorder with anxious mood 10/05/2015    Assessment / Plan: 40 y.o. 10/07/2014 at [redacted]w[redacted]d here for SROM and planned breech delivery.  Labor: on my exam patient is 1.5/90/-3. Foley bulb with 60 cc placed. Start pitocin.  Fetal Wellbeing:  Cat I Pain Control:  planning epidural. Last prophylactic lovenox dose >12h ago, discussed with anesthesia and is ok for epidural PRN.  GBS: positive, penicillin Anticipated MOD:  vaginal breech  GDM: check sugars q4h latent labor, q2h active  labor  [redacted]w[redacted]d, MD/[facility] OB Fellow  03/05/2021, 9:55 AM

## 2021-03-05 NOTE — Progress Notes (Signed)
   03/05/21 2047  Clinical Encounter Type  Visited With Patient not available  Visit Type Code  Referral From Nurse  Consult/Referral To Chaplain   Chaplain responded. The nurse said it is standard to call code blue during a breech birth. Chaplain assistance is not needed. Chaplain remains available for follow-up spiritual/emotional support as needed. This note was prepared by Deneen Harts, M.Div..  For questions please contact by phone [phone].
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
## 2021-03-05 NOTE — Progress Notes (Addendum)
Misty Russell is a 40 y.o. [id number] at [redacted]w[redacted]d admitted for PROM, breech presentation, labor, and being augmented with pitocin  Subjective: Feels better with epidural   Objective: BP 114/74   Pulse (!) 59   Temp 97.8 F (36.6 C) (Oral)   Resp 18   Ht 5\' 4"  (1.626 m)   Wt 76.7 kg   LMP 04/11/2020 (Approximate)   SpO2 97%   BMI 29.01 kg/m  No intake/output data recorded. No intake/output data recorded.  FHT:  FHR: 130 bpm, variability: moderate,  accelerations:  Present,  decelerations:  Absent UC:   regular, every 2-3 minutes SVE:    Complete breech presentation Dilation: 8 Effacement (%): 90 Station: -1 Exam by:: [email or web address]: Lab Results  Component Value Date   WBC 8.6 03/05/2021   HGB 13.3 03/05/2021   HCT 39.8 03/05/2021   MCV 88.4 03/05/2021   PLT 290 03/05/2021    Assessment / Plan: Augmentation of labor, progressing well  Labor: Progressing normally, continue pitocin. Continues to be complete breech on exam. Cat I tracing. Can continue laboring at this time.  Fetal Wellbeing:  Category I Pain Control:  Labor support without medications and Epidural I/D:   GBS + Anticipated MOD:  NSVD  05/05/2021 03/05/2021, 2:11 PM

## 2021-03-05 NOTE — Discharge Summary (Signed)
Postpartum Discharge Summary  Date of Service updated     Patient Name: Misty Russell DOB: 05-Jan-1981 MRN: [id number]  Date of admission: 03/05/2021 Delivery date:03/05/2021  Delivering provider: Florian Buff  Date of discharge: 03/07/2021  Admitting diagnosis: Gestational diabetes [O24.419] Intrauterine pregnancy: [redacted]w[redacted]d    Secondary diagnosis:  Active Problems:   Adjustment disorder with anxious mood   Anticoagulation in pregnancy   Supervision of high risk pregnancy, antepartum   Group B Streptococcus carrier, +RV culture, currently pregnant   Breech presentation on examination, fetus 1   Gestational diabetes  Additional problems: None    Discharge diagnosis: Term Pregnancy Delivered                                              Post partum procedures: None Augmentation: Pitocin and IP Foley Complications: None  Hospital course: Induction of Labor With Vaginal Delivery   40y.o. yo [id number] 345w1das admitted to the hospital 03/05/2021 for induction of labor.  Indication for induction: PROM.  Patient had an uncomplicated labor course as follows: Membrane Rupture Time/Date: 4:05 AM ,03/05/2021   Delivery Method:Vaginal, Breech  Episiotomy: None  Lacerations:  None  Details of delivery can be found in separate delivery note.  Patient had a routine postpartum course. She was restarted on her home lovenox on PPD#1. Patient is discharged home 03/07/21.  Newborn Data: Birth date:03/05/2021  Birth time:8:48 PM  Gender:Female  Living status:Living  Apgars:2 ,8  Weight:3215 g   Magnesium Sulfate received: No BMZ received: No Rhophylac:N/A MMR:N/A T-DaP:Given prenatally Flu: N/A Transfusion:No  Physical exam  Vitals:   03/06/21 0910 03/06/21 1324 03/06/21 1942 03/07/21 0521  BP: 118/75 117/73 118/74 115/73  Pulse: 95 98 93 89  Resp: '18 20 18 18  ' Temp: 98.4 F (36.9 C) 98.5 F (36.9 C) 98 F (36.7 C) 98 F (36.7 C)  TempSrc: Oral Oral Oral Oral  SpO2: 99%  99% 100% 98%  Weight:      Height:       General: alert, cooperative, and no distress Lochia: appropriate Uterine Fundus: firm Incision: N/A DVT Evaluation: No evidence of DVT seen on physical exam. 1+ pitting edema to mid shin present bilaterally and equally without discomfort.  Labs: Lab Results  Component Value Date   WBC 8.6 03/05/2021   HGB 13.3 03/05/2021   HCT 39.8 03/05/2021   MCV 88.4 03/05/2021   PLT 290 03/05/2021   CMP Latest Ref Rng & Units 01/26/2021  Glucose 70 - 99 mg/dL 131(H)  BUN 6 - 20 mg/dL 9  Creatinine 0.44 - 1.00 mg/dL 0.83  Sodium 135 - 145 mmol/L 135  Potassium 3.5 - 5.1 mmol/L 4.3  Chloride 98 - 111 mmol/L 106  CO2 22 - 32 mmol/L 20(L)  Calcium 8.9 - 10.3 mg/dL 9.3  Total Protein 6.5 - 8.1 g/dL 6.9  Total Bilirubin 0.3 - 1.2 mg/dL 0.4  Alkaline Phos 38 - 126 U/L 154(H)  AST 15 - 41 U/L 18  ALT 0 - 44 U/L 16   Edinburgh Score: Edinburgh Postnatal Depression Scale Screening Tool 03/05/2021  I have been able to laugh and see the funny side of things. 0  I have looked forward with enjoyment to things. 0  I have blamed myself unnecessarily when things went wrong. 1  I have been anxious or worried  for no good reason. 0  I have felt scared or panicky for no good reason. 0  Things have been getting on top of me. 1  I have been so unhappy that I have had difficulty sleeping. 0  I have felt sad or miserable. 1  I have been so unhappy that I have been crying. 0  The thought of harming myself has occurred to me. 0  Edinburgh Postnatal Depression Scale Total 3     After visit meds:  Allergies as of 03/07/2021       Reactions   Clobetasol Dermatitis   Extreme Burning.    Phenergan [promethazine] Other (See Comments)   Pt states she does not want to be given this medication because it caused severe drowsiness        Medication List     STOP taking these medications    ASPIRIN 81 PO   freestyle lancets   FREESTYLE LITE test strip Generic  drug: glucose blood   FreeStyle Lite w/Device Kit   PRENATAL PO   valACYclovir 500 MG tablet Commonly known as: Valtrex       TAKE these medications    acetaminophen 325 MG tablet Commonly known as: Tylenol Take 2 tablets (650 mg total) by mouth every 4 (four) hours as needed (for pain scale < 4).   busPIRone 5 MG tablet Commonly known as: BUSPAR TAKE 1 TABLET BY MOUTH ONCE DAILY   enoxaparin 40 MG/0.4ML injection Commonly known as: LOVENOX Inject 0.4 mLs (40 mg total) into the skin daily.   hydrOXYzine 25 MG capsule Commonly known as: VISTARIL Take 1 capsule (25 mg total) by mouth 3 (three) times daily.   ibuprofen 600 MG tablet Commonly known as: ADVIL Take 1 tablet (600 mg total) by mouth every 6 (six) hours.         Discharge home in stable condition Infant Feeding: Breast Infant Disposition:home with mother Discharge instruction: per After Visit Summary and Postpartum booklet. Activity: Advance as tolerated. Pelvic rest for 6 weeks.  Diet: routine diet Future Appointments: Future Appointments  Date Time Provider [place]  04/04/2021  1:55 PM Virginia Rochester, NP [facility]  05/02/2021  1:00 PM Truitt Merle, MD [facility] None   Follow up Visit: Message sent to [facility] by Dr. Cy Blamer on 9/12  Please schedule this patient for a In person postpartum visit in 4 weeks with the following provider: MD. Additional Postpartum F/U:2 hour GTT  High risk pregnancy complicated by: GDM and Bleeding disorder on Lovenox Delivery mode:  Vaginal, Breech  Anticipated Birth Control:   partner vasectomy   03/07/2021 Patriciaann Clan, DO

## 2021-03-05 NOTE — MAU Note (Signed)
Cleone Slim CNM at bedside-ultrasound performed-breech presentation confirmed

## 2021-03-06 LAB — GLUCOSE, CAPILLARY: Glucose-Capillary: 88 mg/dL (ref 70–99)

## 2021-03-06 NOTE — Lactation Note (Signed)
This note was copied from a baby's chart. Lactation Consultation Note  Patient Name: Misty Russell [id number] Date: 03/06/2021 Reason for consult: Follow-up assessment;Mother's request;Difficult latch;Term Age:39 hours  LC assisted Mom latching in biologic nurturing position with signs of milk transfer.   Mom to feed by cues 8-12x 24 hr period. Mom to pre pump with manual 5-10 min before latching.   Mom to offer breast and look for signs of milk transfer with breast compression.  Mom to call for latch assistance if needed.  All questions answered at the end of the visit.   Maternal Data Has patient been taught Hand Expression?: Yes  Feeding Mother's Current Feeding Choice: Breast Milk  LATCH Score Latch: Repeated attempts needed to sustain latch, nipple held in mouth throughout feeding, stimulation needed to elicit sucking reflex.  Audible Swallowing: Spontaneous and intermittent  Type of Nipple: Flat  Comfort (Breast/Nipple): Soft / non-tender  Hold (Positioning): Assistance needed to correctly position infant at breast and maintain latch.  LATCH Score: 7   Lactation Tools Discussed/Used Tools: Nipple Shields Nipple shield size: 20 (Infant mostly chewing in shield no signs of milk transfer noted)  Interventions Interventions: Breast feeding basics reviewed;Breast compression;Assisted with latch;Adjust position;Skin to skin;Support pillows;Hand pump;Breast massage;Position options;Hand express;Expressed milk;Education  Discharge    Consult Status Consult Status: Follow-up Date: 03/07/21 Follow-up type: In-patient    Scherry Laverne  Nicholson-Springer 03/06/2021, 7:21 PM

## 2021-03-06 NOTE — Lactation Note (Signed)
This note was copied from a baby's chart. Lactation Consultation Note  Patient Name: Misty Russell [id number] Date: 03/06/2021 Reason for consult: Initial assessment;Term Age:38 hours   P2 mother whose infant is now 2 hours old.  This is a term baby at 39+1 weeks.    Spoke with RN prior to entering her room.  Baby has had 2 low blood sugar readings:  Initial result was 38 mg/dl at 5537 and follow up result was 30 mg/dl.  Infant was breast fed and given donor breast milk after the initial result.  When this did not raise the blood sugar baby was given 15 mls of Similac 22 calorie Neosure formula.  Lab tech just finished drawing the next blood sugar.   Baby was swaddled and asleep when I arrived.  Mother admitted to being exhausted and not able to hold STS at this time.  Reassurance provided and suggested she and baby rest now and mother can call me when baby is ready to feed next or if the blood sugar reading remains low I will return at her request.  Mother appreciative and in agreement with this plan.  RN updated.   Maternal Data Has patient been taught Hand Expression?: No  Feeding Mother's Current Feeding Choice: Breast Milk and Formula  LATCH Score                    Lactation Tools Discussed/Used    Interventions    Discharge    Consult Status Consult Status: Follow-up Date: 03/06/21 Follow-up type: In-patient    Brissa Asante R Reynol Arnone 03/06/2021, 5:02 AM

## 2021-03-06 NOTE — Lactation Note (Signed)
This note was copied from a baby's chart. Lactation Consultation Note  Patient Name: Misty Russell [id number] Date: 03/06/2021 Reason for consult: Follow-up assessment;Term Age:39 hours  P3 mother whose infant is now 81 hours old.  This is a term baby at 39+1 weeks.    Baby initially had low blood sugars, however, this is now resolved.  Mother provided formula during that time.  Reviewed breast feeding basics with mother.  RN provided a manual pump for nipple eversion.  Reviewed hand expression before/after feedings to help ensure a good milk supply.  Discussed "supply and demand."    Baby was asleep on mother's chest.  Suggested mother call her RN/LC for latch assistance as needed.    Mother is a Producer, television/film/video.  Provided the [employer] DEBP.  All paperwork completed and placed in Sophie's mailbox.  Receipt and insurance care given to mother.  No support person present at this time.     Maternal Data Has patient been taught Hand Expression?: Yes Does the patient have breastfeeding experience prior to this delivery?: Yes  Feeding Mother's Current Feeding Choice: Breast Milk  LATCH Score                    Lactation Tools Discussed/Used    Interventions    Discharge Pump: Personal (Employee pump given) WIC Program: No  Consult Status Consult Status: Follow-up Date: 03/07/21 Follow-up type: In-patient    Dora Sims 03/06/2021, 2:47 PM

## 2021-03-06 NOTE — Progress Notes (Signed)
POSTPARTUM PROGRESS NOTE  Post Partum Day 1  Subjective:  Misty Russell is a 40 y.o. [id number] s/p breech vaginal delivery at [redacted]w[redacted]d.  She reports she is doing well. She denies any problems with ambulating, voiding or po intake. Denies nausea or vomiting.  Pain is well controlled.  Lochia is mild.  Objective: Blood pressure 113/74, pulse 64, temperature 98.2 F (36.8 C), temperature source Oral, resp. rate 17, height 5\' 4"  (1.626 m), weight 76.7 kg, last menstrual period 04/11/2020, SpO2 100 %.  Physical Exam:  General: alert, cooperative and no distress Chest: no respiratory distress Heart:regular rate, distal pulses intact Uterine Fundus: firm, appropriately tender DVT Evaluation: No calf swelling or tenderness Extremities: minimal edema Skin: warm, dry  Recent Labs    03/05/21 0708  HGB 13.3  HCT 39.8    Assessment/Plan: Misty Russell is a 40 y.o. 24 s/p breech VD at [redacted]w[redacted]d   PPD#1 - Doing well  Routine postpartum care  #History of A1GDM: Fasting CBG appropriate this am. 2 hour GTT at pp visit.   #APS: Restart lovenox this am.   Contraception: vasectomy Feeding: breast Dispo: Plan for discharge tomorrow.   LOS: 1 day   [redacted]w[redacted]d, DO  OB Fellow  03/06/2021, 7:22 AM

## 2021-03-06 NOTE — Anesthesia Postprocedure Evaluation (Signed)
Anesthesia Post Note  Patient: Misty Russell  Procedure(s) Performed: AN AD HOC LABOR EPIDURAL     Patient location during evaluation: Mother Baby Anesthesia Type: Epidural Level of consciousness: awake Pain management: satisfactory to patient Vital Signs Assessment: post-procedure vital signs reviewed and stable Respiratory status: spontaneous breathing Cardiovascular status: stable Anesthetic complications: no   No notable events documented.  Last Vitals:  Vitals:   03/06/21 0500 03/06/21 0910  BP: 113/74 118/75  Pulse: 64 95  Resp: 17 18  Temp: 36.8 C 36.9 C  SpO2: 100% 99%    Last Pain:  Vitals:   03/06/21 0915  TempSrc:   PainSc: 4    Pain Goal: Patients Stated Pain Goal: 0 (03/05/21 1540)                 Cephus Shelling

## 2021-03-06 NOTE — Progress Notes (Signed)
Post Partum Day 1 Subjective: Minimal pain well controlled with tylenol, light bleeding without clots, had a bowel movement this morning. Feeling well with no complaints.  Objective: Blood pressure 113/74, pulse 64, temperature 98.2 F (36.8 C), temperature source Oral, resp. rate 17, height 5\' 4"  (1.626 m), weight 76.7 kg, last menstrual period 04/11/2020, SpO2 100 %.  Physical Exam:  General: alert, cooperative, and no distress Lochia: appropriate Uterine Fundus: firm Incision: no incision DVT Evaluation: No evidence of DVT seen on physical exam.  Recent Labs    03/05/21 0708  HGB 13.3  HCT 39.8   Results for orders placed or performed during the hospital encounter of 03/05/21 (from the past 24 hour(s))  Glucose, capillary     Status: None   Collection Time: 03/05/21  8:10 AM  Result Value Ref Range   Glucose-Capillary 79 70 - 99 mg/dL  Glucose, capillary     Status: None   Collection Time: 03/05/21 12:15 PM  Result Value Ref Range   Glucose-Capillary 87 70 - 99 mg/dL  Glucose, capillary     Status: Abnormal   Collection Time: 03/05/21  4:14 PM  Result Value Ref Range   Glucose-Capillary 60 (L) 70 - 99 mg/dL  Glucose, capillary     Status: Abnormal   Collection Time: 03/05/21  4:40 PM  Result Value Ref Range   Glucose-Capillary 66 (L) 70 - 99 mg/dL  Glucose, capillary     Status: None   Collection Time: 03/05/21  5:36 PM  Result Value Ref Range   Glucose-Capillary 74 70 - 99 mg/dL  Glucose, capillary     Status: None   Collection Time: 03/06/21  5:32 AM  Result Value Ref Range   Glucose-Capillary 88 70 - 99 mg/dL   Assessment/Plan: Plan for discharge tomorrow, Breastfeeding, Lactation consult, and Contraception husband getting vasectomy. Will restart Lovenox this afternoon.   LOS: 1 day   03/08/21 03/06/2021, 7:31 AM
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
## 2021-03-07 ENCOUNTER — Other Ambulatory Visit (HOSPITAL_COMMUNITY): Payer: Self-pay

## 2021-03-07 ENCOUNTER — Encounter: Payer: No Typology Code available for payment source | Admitting: Family Medicine

## 2021-03-07 ENCOUNTER — Encounter (HOSPITAL_COMMUNITY): Payer: Self-pay | Admitting: Family Medicine

## 2021-03-07 MED ORDER — ACETAMINOPHEN 325 MG PO TABS: 650.0000 mg | ORAL_TABLET | ORAL | Status: DC | PRN

## 2021-03-07 MED ORDER — IBUPROFEN 600 MG PO TABS
600.0000 mg | ORAL_TABLET | Freq: Four times a day (QID) | ORAL | 0 refills | Status: DC
  Filled 2021-03-07: qty 30, 8d supply, fill #0

## 2021-03-07 NOTE — Lactation Note (Signed)
This note was copied from a baby's chart. Lactation Consultation Note  Patient Name: Misty Russell [id number] Date: 03/07/2021 Reason for consult: Follow-up assessment;Primapara;1st time breastfeeding;Term Age:39 hours  P3 mother whose infant is now 75 hours old.  This is a term baby at 39+1 weeks.  Mother breast feeding in the cradle hold when I arrived; baby with a shallow latch.  Assisted to show mother the cross cradle hold and latch deeply.  Mother reported this felt "different" than what she has been experiencing.  Discussed the importance of obtaining a deep latch and keeping baby actively sucking at the breast.  Mother verbalized understanding.    Family has been discharged.  Mother has our OP phone number and will call for any further questions/concerns.  Encouraged to continue practicing deep latching.  Father present.  Mother has received her DEBP.    Maternal Data    Feeding Mother's Current Feeding Choice: Breast Milk  LATCH Score                    Lactation Tools Discussed/Used    Interventions Interventions: Education  Discharge Discharge Education: Engorgement and breast care Pump: Personal  Consult Status Consult Status: Complete Date: 03/07/21 Follow-up type: Call as needed    Dajane Valli R Jennie Hannay 03/07/2021, 2:59 PM

## 2021-03-08 ENCOUNTER — Inpatient Hospital Stay (HOSPITAL_COMMUNITY)
Admission: AD | Admit: 2021-03-08 | Payer: No Typology Code available for payment source | Source: Home / Self Care | Admitting: Obstetrics & Gynecology

## 2021-03-08 ENCOUNTER — Inpatient Hospital Stay (HOSPITAL_COMMUNITY): Payer: No Typology Code available for payment source

## 2021-03-11 ENCOUNTER — Inpatient Hospital Stay (HOSPITAL_COMMUNITY): Admit: 2021-03-11 | Payer: Self-pay

## 2021-03-12 ENCOUNTER — Other Ambulatory Visit: Payer: Self-pay | Admitting: *Deleted

## 2021-03-12 ENCOUNTER — Other Ambulatory Visit (HOSPITAL_COMMUNITY): Payer: Self-pay

## 2021-03-12 ENCOUNTER — Encounter: Payer: Self-pay | Admitting: *Deleted

## 2021-03-12 NOTE — Patient Outreach (Signed)
[facility]) Care Management  03/12/2021  Misty Russell March 24, 1981 [id number]   Transition of care call/Diabetes during prenancy /Case Closure   Referral received:12/27/20 Initial outreach:12/29/20 Insurance: [employer] Focus plan Post discharge follow up initial outreach : 03/12/21   Subjective: Initial successful telephone call to patient's preferred number in order to complete transition of care assessment; 2 HIPAA identifiers verified. Explained purpose of call and completed transition of care assessment.  Misty Russell states that she is doing good. She denies post vaginal delivery problems of baby girl Misty Russell reports minimal pain well managed with prescribed medications,tolerating diet, denies bowel or bladder problems.  Spouse is assisting with her  recovery.She discussed baby is doing well , she had family support regarding lactation from family member lactation that is  specialist and baby if off and running with breast feeding , has 2nd post discharge follow up appointment on today. Misty Russell denies issue with elevated blood sugars during hospital stay, no longer need to monitor blood sugars. She reports continuing to take Lovenox for 6 weeks after delivery and denies issues.   Reviewed accessing the following [employer] Benefits : She has completed Diabetes during pregnancy for gestational diabetes, denies need for ongoing needs.  She is accessing FMLA benefit.  She uses a [facility] outpatient pharmacy at [facility] outpatient pharmacy.     Objective:  Misty Russell  was hospitalized at [facility] 9/12-9/14 for term vaginal delivery  female .Comorbidities include: Gestational diabetes, high risk preganancy, Positive Beta2 glycoprotein IgG, possible antiphospholipid syndrome  She  was discharged to home on 9/14.22 without the need for home health services or DME.   Assessment:  Patient voices good understanding of all discharge instructions.  See transition of care  flowsheet for assessment details.   Plan:  Reviewed hospital discharge diagnosis of Term pregnancy delivery  and discharge treatment plan using hospital discharge instructions, assessing medication adherence, reviewing problems requiring provider notification, and discussing the importance of follow up with surgeon, primary care provider and/or specialists as directed.  Reviewed [place] healthy lifestyle program information to receive discounted premium for  2023   Step 1: Get  your annual physical  Step 2: Complete your health assessment  Step 3:Identify your current health status and complete the corresponding action step between June 24, 2020 and February 22, 2021.        No ongoing care management needs identified so will close case to [facility] Care Management services . Will send email message to [name], Pharmacy, [name], Benefits to notify of  patient delivery and case closure.    Egbert Garibaldi, RN, BSN  [facility] Care Management,Care Management Coordinator  [phone]- Mobile [phone]- Toll Free Main Office

## 2021-03-16 ENCOUNTER — Other Ambulatory Visit (HOSPITAL_COMMUNITY): Payer: Self-pay

## 2021-03-20 ENCOUNTER — Telehealth (HOSPITAL_COMMUNITY): Payer: Self-pay | Admitting: *Deleted

## 2021-03-20 NOTE — Telephone Encounter (Signed)
Mom reports feeling good. Had a few HA's, but nothing persistent. No concerns about herself. EPDS=3 (hospital score=3) Mom reports baby is well. Gaining weight and feeding without difficulty. Peeing and pooping well. Sleeps on back in bassinet in mom's room. Reminded of ABC's of safe sleep. Mom reports no concerns about baby.  Duffy Rhody, RN 03-20-2021 at 10:14am

## 2021-03-23 ENCOUNTER — Encounter: Payer: Self-pay | Admitting: Radiology

## 2021-03-30 ENCOUNTER — Other Ambulatory Visit (HOSPITAL_COMMUNITY): Payer: Self-pay

## 2021-04-04 ENCOUNTER — Ambulatory Visit: Payer: No Typology Code available for payment source | Admitting: Nurse Practitioner

## 2021-04-20 ENCOUNTER — Other Ambulatory Visit (HOSPITAL_BASED_OUTPATIENT_CLINIC_OR_DEPARTMENT_OTHER): Payer: Self-pay

## 2021-04-20 MED ORDER — FLUARIX QUADRIVALENT 0.5 ML IM SUSY
PREFILLED_SYRINGE | INTRAMUSCULAR | 0 refills | Status: DC
  Filled 2021-04-20: qty 0.5, 1d supply, fill #0

## 2021-05-02 ENCOUNTER — Telehealth: Payer: Self-pay

## 2021-05-02 ENCOUNTER — Inpatient Hospital Stay: Payer: No Typology Code available for payment source | Attending: Hematology | Admitting: Hematology

## 2021-05-02 NOTE — Telephone Encounter (Signed)
Spoke with pt via telephone to remind her of her telehealth visit today with Dr. Mosetta Putt through MyChart at 1pm.  Pt stated she will have to reschedule today's appt d/t personal obligations.  Pt will call scheduling at [phone] to reschedule her appt today.

## 2021-06-29 ENCOUNTER — Other Ambulatory Visit (HOSPITAL_COMMUNITY): Payer: Self-pay

## 2021-07-10 ENCOUNTER — Encounter: Payer: Self-pay | Admitting: Family Medicine

## 2021-07-11 ENCOUNTER — Other Ambulatory Visit: Payer: Self-pay

## 2021-07-11 ENCOUNTER — Other Ambulatory Visit (HOSPITAL_COMMUNITY): Payer: Self-pay

## 2021-07-11 MED ORDER — VALACYCLOVIR HCL 500 MG PO TABS
500.0000 mg | ORAL_TABLET | Freq: Two times a day (BID) | ORAL | 2 refills | Status: DC
  Filled 2021-07-11: qty 60, 30d supply, fill #0
  Filled 2021-09-27: qty 60, 30d supply, fill #1
  Filled 2022-02-18: qty 60, 30d supply, fill #2

## 2021-08-03 ENCOUNTER — Other Ambulatory Visit: Payer: Self-pay

## 2021-08-03 ENCOUNTER — Encounter: Payer: Self-pay | Admitting: Orthopedic Surgery

## 2021-08-03 ENCOUNTER — Ambulatory Visit (INDEPENDENT_AMBULATORY_CARE_PROVIDER_SITE_OTHER): Payer: 59 | Admitting: Orthopedic Surgery

## 2021-08-03 DIAGNOSIS — M654 Radial styloid tenosynovitis [de Quervain]: Secondary | ICD-10-CM

## 2021-08-03 MED ORDER — BETAMETHASONE SOD PHOS & ACET 6 (3-3) MG/ML IJ SUSP
6.0000 mg | INTRAMUSCULAR | Status: AC | PRN
  Administered 2021-08-03: 6 mg via INTRA_ARTICULAR

## 2021-08-03 MED ORDER — LIDOCAINE HCL 1 % IJ SOLN
1.0000 mL | INTRAMUSCULAR | Status: AC | PRN
Start: 2021-08-03 — End: 2021-08-03
  Administered 2021-08-03: 1 mL
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
## 2021-09-12 DIAGNOSIS — G43001 Migraine without aura, not intractable, with status migrainosus: Secondary | ICD-10-CM | POA: Diagnosis not present

## 2021-09-12 DIAGNOSIS — Z03818 Encounter for observation for suspected exposure to other biological agents ruled out: Secondary | ICD-10-CM | POA: Diagnosis not present

## 2021-09-12 DIAGNOSIS — Z20822 Contact with and (suspected) exposure to covid-19: Secondary | ICD-10-CM | POA: Diagnosis not present

## 2021-09-12 DIAGNOSIS — J029 Acute pharyngitis, unspecified: Secondary | ICD-10-CM | POA: Diagnosis not present

## 2021-09-27 ENCOUNTER — Other Ambulatory Visit (HOSPITAL_COMMUNITY): Payer: Self-pay

## 2022-02-18 ENCOUNTER — Other Ambulatory Visit (HOSPITAL_COMMUNITY): Payer: Self-pay

## 2022-02-19 ENCOUNTER — Encounter: Payer: Self-pay | Admitting: Family Medicine

## 2022-02-19 ENCOUNTER — Other Ambulatory Visit (HOSPITAL_COMMUNITY): Payer: Self-pay

## 2022-02-19 ENCOUNTER — Ambulatory Visit (INDEPENDENT_AMBULATORY_CARE_PROVIDER_SITE_OTHER): Payer: 59 | Admitting: Family Medicine

## 2022-02-19 VITALS — BP 108/68 | HR 75 | Temp 97.6°F | Ht 64.0 in | Wt 136.8 lb

## 2022-02-19 DIAGNOSIS — Z0000 Encounter for general adult medical examination without abnormal findings: Secondary | ICD-10-CM | POA: Diagnosis not present

## 2022-02-19 DIAGNOSIS — A6000 Herpesviral infection of urogenital system, unspecified: Secondary | ICD-10-CM | POA: Diagnosis not present

## 2022-02-19 MED ORDER — VALACYCLOVIR HCL 500 MG PO TABS
500.0000 mg | ORAL_TABLET | Freq: Two times a day (BID) | ORAL | 3 refills | Status: DC
  Filled 2022-02-19: qty 180, 90d supply, fill #0

## 2022-02-19 NOTE — Progress Notes (Signed)
Established Patient Office Visit  Subjective   Patient ID: Misty Russell, female    DOB: May 17, 1981  Age: 41 y.o. MRN: [id number]  Chief Complaint  Patient presents with   Annual Exam    CPE, no concerns. Patient not fasting.     HPI for physical exam today.  Doing well.  Mother of a 30-year-old baby girl,  Maddie.  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  She has regular dental and GYN care.  She is on suppression therapy for 10 herpes with Valtrex.  It is worked well for her over the years.  Like to continue it.  No regular physical exercise.  She developed gestational diabetes controlled with diet    Review of Systems  Constitutional: Negative.   HENT: Negative.    Eyes:  Negative for blurred vision, discharge and redness.  Respiratory: Negative.    Cardiovascular: Negative.   Gastrointestinal:  Negative for abdominal pain.  Genitourinary: Negative.   Musculoskeletal: Negative.  Negative for myalgias.  Skin:  Negative for rash.  Neurological:  Negative for tingling, loss of consciousness and weakness.  Endo/Heme/Allergies:  Negative for polydipsia.      Objective:     BP 108/68 (BP Location: Right Arm, Patient Position: Sitting, Cuff Size: Normal)   Pulse 75   Temp 97.6 F (36.4 C) (Temporal)   Ht 5\' 4"  (1.626 m)   Wt 136 lb 12.8 oz (62.1 kg)   SpO2 98%   BMI 23.48 kg/m    Physical Exam Constitutional:      General: She is not in acute distress.    Appearance: Normal appearance. She is not ill-appearing, toxic-appearing or diaphoretic.  HENT:     Head: Normocephalic and atraumatic.     Right Ear: External ear normal.     Left Ear: External ear normal.     Mouth/Throat:     Mouth: Mucous membranes are moist.     Pharynx: Oropharynx is clear. No oropharyngeal exudate or posterior oropharyngeal erythema.  Eyes:     General: No scleral icterus.       Right eye: No discharge.        Left eye: No discharge.     Extraocular  Movements: Extraocular movements intact.     Conjunctiva/sclera: Conjunctivae normal.     Pupils: Pupils are equal, round, and reactive to light.  Cardiovascular:     Rate and Rhythm: Normal rate and regular rhythm.  Pulmonary:     Effort: Pulmonary effort is normal. No respiratory distress.     Breath sounds: Normal breath sounds.  Abdominal:     General: Bowel sounds are normal.     Tenderness: There is no guarding.  Musculoskeletal:     Cervical back: No rigidity or tenderness.  Skin:    General: Skin is warm and dry.  Neurological:     Mental Status: She is alert and oriented to person, place, and time.  Psychiatric:        Mood and Affect: Mood normal.        Behavior: Behavior normal.      No results found for any visits on 02/19/22.    The ASCVD Risk score (Arnett DK, et al., 2019) failed to calculate for the following reasons:   The 2019 ASCVD risk score is only valid for ages 57 to 44    Assessment & Plan:   Problem List Items Addressed This Visit       Genitourinary  Genital herpes simplex   Relevant Medications   valACYclovir (VALTREX) 500 MG tablet   Other Visit Diagnoses     Healthcare maintenance    -  Primary   Relevant Orders   CBC   Comprehensive metabolic panel   Hemoglobin A1c   Lipid panel   Urinalysis, Routine w reflex microscopic       Return in about 1 year (around 02/20/2023), or Return fasting for blood work in the next few weeks..  Increase exercise by walking.  Is aware that gestational diabetes is a marker of adult onset diabetes.  Will return fasting for above ordered blood work.  Refill on tracks for suppression of genital herpes.  Mliss Sax, MD

## 2022-02-21 ENCOUNTER — Telehealth: Payer: Self-pay | Admitting: Family Medicine

## 2022-02-21 ENCOUNTER — Other Ambulatory Visit (HOSPITAL_COMMUNITY): Payer: Self-pay

## 2022-02-21 DIAGNOSIS — B379 Candidiasis, unspecified: Secondary | ICD-10-CM

## 2022-02-21 MED ORDER — FLUCONAZOLE 150 MG PO TABS
150.0000 mg | ORAL_TABLET | Freq: Once | ORAL | 0 refills | Status: AC
Start: 2022-02-21 — End: 2022-02-22
  Filled 2022-02-21: qty 1, 1d supply, fill #0

## 2022-02-21 NOTE — Telephone Encounter (Signed)
Pt is breast feeding her infant and she has thrush in her mouth(her baby) from being breat feed. Her pediatrician is wanting her to get a script for Diflucan. I let her know she may need an app, she declined. Please advise pt @ [phone]

## 2022-02-21 NOTE — Telephone Encounter (Signed)
Please advise message below patient calling for Rx for Diflucan states that baby has thrush she was told to call mothers Primary for prescription?

## 2022-02-21 NOTE — Telephone Encounter (Signed)
Patient aware Rx sent in  

## 2022-03-22 ENCOUNTER — Other Ambulatory Visit (HOSPITAL_COMMUNITY): Payer: Self-pay

## 2022-03-22 DIAGNOSIS — F4312 Post-traumatic stress disorder, chronic: Secondary | ICD-10-CM | POA: Diagnosis not present

## 2022-03-22 DIAGNOSIS — F411 Generalized anxiety disorder: Secondary | ICD-10-CM | POA: Diagnosis not present

## 2022-03-22 MED ORDER — SERTRALINE HCL 50 MG PO TABS
50.0000 mg | ORAL_TABLET | Freq: Every day | ORAL | 0 refills | Status: DC
  Filled 2022-03-22: qty 30, 30d supply, fill #0

## 2022-08-29 DIAGNOSIS — Z6825 Body mass index (BMI) 25.0-25.9, adult: Secondary | ICD-10-CM | POA: Diagnosis not present

## 2022-08-29 DIAGNOSIS — Z01419 Encounter for gynecological examination (general) (routine) without abnormal findings: Secondary | ICD-10-CM | POA: Diagnosis not present

## 2022-12-12 DIAGNOSIS — F4312 Post-traumatic stress disorder, chronic: Secondary | ICD-10-CM | POA: Diagnosis not present

## 2022-12-19 DIAGNOSIS — F4312 Post-traumatic stress disorder, chronic: Secondary | ICD-10-CM | POA: Diagnosis not present

## 2022-12-24 DIAGNOSIS — F4312 Post-traumatic stress disorder, chronic: Secondary | ICD-10-CM | POA: Diagnosis not present

## 2022-12-25 ENCOUNTER — Encounter: Payer: Self-pay | Admitting: Student

## 2022-12-25 ENCOUNTER — Ambulatory Visit: Payer: 59 | Admitting: Student

## 2022-12-25 ENCOUNTER — Other Ambulatory Visit: Payer: Self-pay

## 2022-12-25 VITALS — BP 112/62 | HR 75 | Temp 98.3°F | Ht 64.0 in | Wt 146.6 lb

## 2022-12-25 DIAGNOSIS — F419 Anxiety disorder, unspecified: Secondary | ICD-10-CM

## 2022-12-25 DIAGNOSIS — Z8632 Personal history of gestational diabetes: Secondary | ICD-10-CM | POA: Diagnosis not present

## 2022-12-25 DIAGNOSIS — Z0000 Encounter for general adult medical examination without abnormal findings: Secondary | ICD-10-CM | POA: Insufficient documentation
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
## 2022-12-25 NOTE — Patient Instructions (Signed)
Thank you, Ms.Misty Russell for allowing [place] to provide your care today. Today we discussed your general health and discussed your relevant medical history and that of your family. We did an extensive physical exam and addressed any question you had.You reported your anxiety is well controlled with non-medication therapy. - I will order a blood work you to today to see how your organs are doing  - I will call you when the results come back - I will follow up with you in  one year but please don't hesitate to call me if you have any concerns we can address for you   I have ordered the following labs for you:  Lab Orders         BMP8+Anion Gap         CBC no Diff         Lipid Profile         Hemoglobin A1c       Referrals ordered today:   Referral Orders  No referral(s) requested today     I have ordered the following medication/changed the following medications:   Stop the following medications: Medications Discontinued During This Encounter  Medication Reason   sertraline (ZOLOFT) 50 MG tablet Completed Course   valACYclovir (VALTREX) 500 MG tablet Completed Course     Start the following medications: No orders of the defined types were placed in this encounter.    Follow up: 1 year   Should you have any questions or concerns please call the internal medicine clinic at [phone].    Kathleen Lime, M.D [facility]  Es
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
## 2022-12-25 NOTE — Assessment & Plan Note (Addendum)
History of gestational diabetes 2 years ago when she was pregnant with her youngest daughter, has not followed up since giving birth. Denies any excessive thirst or polyuria. Plan    - Order hemoglobin A1c.

## 2022-12-25 NOTE — Assessment & Plan Note (Addendum)
Patient reports a  history of anxiety.  She is currently taking no medication for it, says she uses positive words of affirmation whenever she is anxious and seems to be working fine for her. Her GAD-7 score in the office today is 0,PHQ is 0.  Medication therapy and available resources for anxiety has been adequately discussed with the patient and advised to contact our office if she has any further concerns regarding her anxiety.

## 2022-12-26 LAB — LIPID PANEL

## 2022-12-27 LAB — CBC
Hematocrit: 36.7 % (ref 34.0–46.6)
Hemoglobin: 12.3 g/dL (ref 11.1–15.9)
MCH: 29.6 pg (ref 26.6–33.0)
MCHC: 33.5 g/dL (ref 31.5–35.7)
MCV: 88 fL (ref 79–97)
Platelets: 280 10*3/uL (ref 150–450)
RBC: 4.15 x10E6/uL (ref 3.77–5.28)
RDW: 13.1 % (ref 11.7–15.4)
WBC: 9.3 10*3/uL (ref 3.4–10.8)

## 2022-12-27 LAB — LIPID PANEL
Chol/HDL Ratio: 4.5 ratio — ABNORMAL HIGH (ref 0.0–4.4)
Cholesterol, Total: 170 mg/dL (ref 100–199)
HDL: 38 mg/dL — ABNORMAL LOW (ref >39)
LDL Chol Calc (NIH): 102 mg/dL — ABNORMAL HIGH (ref 0–99)
Triglycerides: 173 mg/dL — ABNORMAL HIGH (ref 0–149)
VLDL Cholesterol Cal: 30 mg/dL (ref 5–40)

## 2022-12-27 LAB — BMP8+ANION GAP
Anion Gap: 16 mmol/L (ref 10.0–18.0)
BUN/Creatinine Ratio: 10 (ref 9–23)
BUN: 8 mg/dL (ref 6–24)
CO2: 21 mmol/L (ref 20–29)
Calcium: 9.2 mg/dL (ref 8.7–10.2)
Chloride: 103 mmol/L (ref 96–106)
Creatinine, Ser: 0.8 mg/dL (ref 0.57–1.00)
Glucose: 80 mg/dL (ref 70–99)
Potassium: 4 mmol/L (ref 3.5–5.2)
Sodium: 140 mmol/L (ref 134–144)
eGFR: 95 mL/min/{1.73_m2} (ref >59)

## 2022-12-27 LAB — HEMOGLOBIN A1C
Est. average glucose Bld gHb Est-mCnc: 120 mg/dL
Hgb A1c MFr Bld: 5.8 % — ABNORMAL HIGH (ref 4.8–5.6)

## 2022-12-31 NOTE — Progress Notes (Signed)
Internal Medicine Clinic Attending  I was physically present during the key portions of the resident provided service and participated in the medical decision making of patient's management care. I reviewed pertinent patient test results.  The assessment, diagnosis, and plan were formulated together and I agree with the documentation in the resident's note.  Herbert Marken, MD  

## 2023-01-02 DIAGNOSIS — F4312 Post-traumatic stress disorder, chronic: Secondary | ICD-10-CM | POA: Diagnosis not present

## 2023-01-09 ENCOUNTER — Other Ambulatory Visit (HOSPITAL_COMMUNITY): Payer: Self-pay

## 2023-01-09 DIAGNOSIS — L292 Pruritus vulvae: Secondary | ICD-10-CM | POA: Diagnosis not present

## 2023-01-09 MED ORDER — ESTRADIOL 0.1 MG/GM VA CREA
1.0000 | TOPICAL_CREAM | VAGINAL | 0 refills | Status: AC
  Filled 2023-01-09: qty 42.5, 30d supply, fill #0

## 2023-01-09 MED ORDER — ESTRADIOL 0.1 MG/GM VA CREA: TOPICAL_CREAM | VAGINAL | 0 refills | Status: DC

## 2023-01-09 MED ORDER — NYSTATIN-TRIAMCINOLONE 100000-0.1 UNIT/GM-% EX OINT
1.0000 | TOPICAL_OINTMENT | Freq: Two times a day (BID) | CUTANEOUS | 0 refills | Status: AC
  Filled 2023-01-09: qty 30, 15d supply, fill #0

## 2023-01-16 DIAGNOSIS — F4312 Post-traumatic stress disorder, chronic: Secondary | ICD-10-CM | POA: Diagnosis not present

## 2023-01-24 ENCOUNTER — Other Ambulatory Visit (HOSPITAL_COMMUNITY): Payer: Self-pay

## 2023-01-27 DIAGNOSIS — F4312 Post-traumatic stress disorder, chronic: Secondary | ICD-10-CM | POA: Diagnosis not present

## 2023-01-31 ENCOUNTER — Ambulatory Visit: Payer: Commercial Managed Care - PPO | Admitting: Nurse Practitioner

## 2023-02-18 DIAGNOSIS — F4312 Post-traumatic stress disorder, chronic: Secondary | ICD-10-CM | POA: Diagnosis not present

## 2023-03-03 DIAGNOSIS — F4312 Post-traumatic stress disorder, chronic: Secondary | ICD-10-CM | POA: Diagnosis not present

## 2023-03-31 DIAGNOSIS — F4312 Post-traumatic stress disorder, chronic: Secondary | ICD-10-CM | POA: Diagnosis not present

## 2023-04-04 ENCOUNTER — Other Ambulatory Visit (HOSPITAL_COMMUNITY): Payer: Self-pay

## 2023-04-04 MED ORDER — INFLUENZA VIRUS VACC SPLIT PF (FLUZONE) 0.5 ML IM SUSY
0.5000 mL | PREFILLED_SYRINGE | Freq: Once | INTRAMUSCULAR | 0 refills | Status: AC
  Filled 2023-04-04: qty 0.5, 1d supply, fill #0

## 2023-04-14 DIAGNOSIS — N905 Atrophy of vulva: Secondary | ICD-10-CM | POA: Diagnosis not present

## 2023-04-14 DIAGNOSIS — L900 Lichen sclerosus et atrophicus: Secondary | ICD-10-CM | POA: Diagnosis not present

## 2023-04-14 DIAGNOSIS — Z1231 Encounter for screening mammogram for malignant neoplasm of breast: Secondary | ICD-10-CM | POA: Diagnosis not present

## 2023-05-03 ENCOUNTER — Emergency Department (HOSPITAL_BASED_OUTPATIENT_CLINIC_OR_DEPARTMENT_OTHER): Payer: 59

## 2023-05-03 ENCOUNTER — Emergency Department (HOSPITAL_BASED_OUTPATIENT_CLINIC_OR_DEPARTMENT_OTHER)
Admission: EM | Admit: 2023-05-03 | Discharge: 2023-05-03 | Disposition: A | Discharge status: Home / Self Care | Payer: 59 | Attending: Emergency Medicine | Admitting: Emergency Medicine

## 2023-05-03 ENCOUNTER — Other Ambulatory Visit: Payer: Self-pay

## 2023-05-03 ENCOUNTER — Encounter (HOSPITAL_BASED_OUTPATIENT_CLINIC_OR_DEPARTMENT_OTHER): Payer: Self-pay

## 2023-05-03 DIAGNOSIS — R519 Headache, unspecified: Secondary | ICD-10-CM | POA: Insufficient documentation

## 2023-05-03 DIAGNOSIS — G43109 Migraine with aura, not intractable, without status migrainosus: Secondary | ICD-10-CM

## 2023-05-03 DIAGNOSIS — R29818 Other symptoms and signs involving the nervous system: Secondary | ICD-10-CM | POA: Insufficient documentation

## 2023-05-03 DIAGNOSIS — F419 Anxiety disorder, unspecified: Secondary | ICD-10-CM | POA: Insufficient documentation

## 2023-05-03 DIAGNOSIS — D6861 Antiphospholipid syndrome: Secondary | ICD-10-CM | POA: Diagnosis not present

## 2023-05-03 DIAGNOSIS — G44019 Episodic cluster headache, not intractable: Secondary | ICD-10-CM

## 2023-05-03 DIAGNOSIS — Z0389 Encounter for observation for other suspected diseases and conditions ruled out: Secondary | ICD-10-CM | POA: Diagnosis not present

## 2023-05-03 DIAGNOSIS — D320 Benign neoplasm of cerebral meninges: Secondary | ICD-10-CM | POA: Diagnosis not present

## 2023-05-03 DIAGNOSIS — R531 Weakness: Secondary | ICD-10-CM | POA: Diagnosis not present

## 2023-05-03 DIAGNOSIS — G43809 Other migraine, not intractable, without status migrainosus: Secondary | ICD-10-CM | POA: Diagnosis not present

## 2023-05-03 DIAGNOSIS — R000 Tachycardia, unspecified: Secondary | ICD-10-CM | POA: Diagnosis not present

## 2023-05-03 DIAGNOSIS — I639 Cerebral infarction, unspecified: Secondary | ICD-10-CM | POA: Insufficient documentation

## 2023-05-03 LAB — COMPREHENSIVE METABOLIC PANEL
ALT: 18 U/L (ref 0–44)
AST: 21 U/L (ref 15–41)
Albumin: 4.3 g/dL (ref 3.5–5.0)
Alkaline Phosphatase: 68 U/L (ref 38–126)
Anion gap: 9 (ref 5–15)
BUN: 8 mg/dL (ref 6–20)
CO2: 24 mmol/L (ref 22–32)
Calcium: 9.5 mg/dL (ref 8.9–10.3)
Chloride: 106 mmol/L (ref 98–111)
Creatinine, Ser: 0.69 mg/dL (ref 0.44–1.00)
GFR, Estimated: >60 mL/min (ref >60)
Glucose, Bld: 82 mg/dL (ref 70–99)
Potassium: 3.8 mmol/L (ref 3.5–5.1)
Sodium: 139 mmol/L (ref 135–145)
Total Bilirubin: 0.9 mg/dL (ref <1.2)
Total Protein: 8.3 g/dL — ABNORMAL HIGH (ref 6.5–8.1)

## 2023-05-03 LAB — PROTIME-INR
INR: 1 (ref 0.8–1.2)
Prothrombin Time: 12.9 s (ref 11.4–15.2)

## 2023-05-03 LAB — CBC WITH DIFFERENTIAL/PLATELET
Abs Immature Granulocytes: 0.04 10*3/uL (ref 0.00–0.07)
Basophils Absolute: 0 10*3/uL (ref 0.0–0.1)
Basophils Relative: 0 %
Eosinophils Absolute: 0.1 10*3/uL (ref 0.0–0.5)
Eosinophils Relative: 0 %
HCT: 38.5 % (ref 36.0–46.0)
Hemoglobin: 13 g/dL (ref 12.0–15.0)
Immature Granulocytes: 0 %
Lymphocytes Relative: 11 %
Lymphs Abs: 1.4 10*3/uL (ref 0.7–4.0)
MCH: 29.5 pg (ref 26.0–34.0)
MCHC: 33.8 g/dL (ref 30.0–36.0)
MCV: 87.5 fL (ref 80.0–100.0)
Monocytes Absolute: 0.3 10*3/uL (ref 0.1–1.0)
Monocytes Relative: 3 %
Neutro Abs: 10.7 10*3/uL — ABNORMAL HIGH (ref 1.7–7.7)
Neutrophils Relative %: 86 %
Platelets: 284 10*3/uL (ref 150–400)
RBC: 4.4 MIL/uL (ref 3.87–5.11)
RDW: 13.4 % (ref 11.5–15.5)
WBC: 12.5 10*3/uL — ABNORMAL HIGH (ref 4.0–10.5)
nRBC: 0 % (ref 0.0–0.2)

## 2023-05-03 LAB — ETHANOL: Alcohol, Ethyl (B): <10 mg/dL (ref <10)

## 2023-05-03 LAB — CBG MONITORING, ED: Glucose-Capillary: 79 mg/dL (ref 70–99)

## 2023-05-03 LAB — APTT: aPTT: 27 s (ref 24–36)

## 2023-05-03 MED ORDER — DIPHENHYDRAMINE HCL 50 MG/ML IJ SOLN
50.0000 mg | Freq: Once | INTRAMUSCULAR | Status: AC
  Administered 2023-05-03: 50 mg via INTRAVENOUS
  Filled 2023-05-03: qty 1

## 2023-05-03 MED ORDER — BUTALBITAL-APAP-CAFFEINE 50-325-40 MG PO TABS
1.0000 | ORAL_TABLET | Freq: Three times a day (TID) | ORAL | 0 refills | Status: DC | PRN
Start: 2023-05-03 — End: 2023-05-03
  Filled 2023-05-03: qty 10, 2d supply, fill #0

## 2023-05-03 MED ORDER — BUTALBITAL-APAP-CAFFEINE 50-325-40 MG PO TABS
1.0000 | ORAL_TABLET | Freq: Three times a day (TID) | ORAL | 0 refills | Status: DC | PRN
Start: 2023-05-03 — End: 2023-05-05

## 2023-05-03 MED ORDER — ONDANSETRON HCL 4 MG/2ML IJ SOLN
4.0000 mg | Freq: Four times a day (QID) | INTRAMUSCULAR | Status: DC | PRN
  Administered 2023-05-03: 4 mg via INTRAVENOUS
  Filled 2023-05-03: qty 2

## 2023-05-03 MED ORDER — IOHEXOL 350 MG/ML SOLN
75.0000 mL | Freq: Once | INTRAVENOUS | Status: AC | PRN
  Administered 2023-05-03: 75 mL via INTRAVENOUS

## 2023-05-03 MED ORDER — ASPIRIN 81 MG PO CHEW: 81.0000 mg | CHEWABLE_TABLET | Freq: Every day | ORAL | 1 refills | Status: AC

## 2023-05-03 MED ORDER — ASPIRIN 81 MG PO CHEW
81.0000 mg | CHEWABLE_TABLET | Freq: Every day | ORAL | 1 refills | Status: DC
  Filled 2023-05-03: qty 30, 30d supply, fill #0

## 2023-05-03 MED ORDER — METOCLOPRAMIDE HCL 5 MG/ML IJ SOLN
10.0000 mg | Freq: Once | INTRAMUSCULAR | Status: AC
  Administered 2023-05-03: 10 mg via INTRAVENOUS
  Filled 2023-05-03: qty 2

## 2023-05-03 NOTE — ED Notes (Addendum)
Reports cluster HA last night, HA today reports as different. RT side pain. Also reports RT arm "feels different", weakness. Also RT side face. Speech clear, no facial droop noted.

## 2023-05-03 NOTE — ED Notes (Signed)
Pt on tele with Neuro

## 2023-05-03 NOTE — ED Notes (Addendum)
Pt. HR elevated to 164, states she can't breath. Called for assisstance and Doctor Fredderick Phenix and [address] RN in room.

## 2023-05-03 NOTE — ED Notes (Signed)
Patient transported to CT 

## 2023-05-03 NOTE — Consult Note (Addendum)
[facility] Telemedicine Consult   Requesting Provider: Dr. Fredderick Phenix  Chief Complaint: headache and right arm numbness  HPI: 42 year old female with history of cluster headache, anxiety, gestational diabetes, possible antiphospholipid syndrome presented to ED for headache and right arm numbness.  Per patient, she had history of cluster headache in the past, was treated with Imitrex and oxygen before.  She stated that oxygen did not work for her before but she had no cluster headache for the last 2 years.  Last night she woke up at 3 AM with severe headache around right eye, which was typical for her cluster headache.  She took her expired Imitrex at home, after 45 minutes, headache improved and she was able to get back to sleep.  Today around 12 PM she walked into a restaurant with her family and felt some HA.  The headache was right frontal and back of the head, different from her cluster headache, she had 2 tablets of Tylenol but did not work.  She went back to her car and started feeling numbness on the right face neck and arm.  Headache was 14 out of 10, sharp, with phonophobia and photophobia and nausea.  She was sent to ER for evaluation.  CT no acute abnormality but incidental left small frontal calcified meningioma.  She stated that her headache still there but her right-sided arm face numbness is getting some improvement.  BP 114/80, glucose 79.  Per chart, she also had a history of possible antiphospholipid syndrome.  She had 2 noncomplicated pregnancies and deliveries followed by 2 early pregnancy losses at the 4 and 6 weeks in 2021.  Her beta-2 glycoprotein antibody IgG was elevated in 09/2019 with value of 61.  She was seen in oncology and repeat antibody IgG in 12/2019, again elevated at 83.  No history of thrombosis.  She was put on aspirin 81.  She followed with Dr. Mosetta Putt and felt to be possible antiphospholipid syndrome fulfilled lab criteria but not clinical criteria.  She then  had again pregnancy in 2022, was put on Lovenox and she had full-term delivery in 01/2021 but since then she lost follow-up.  She is not on antithrombotics at home currently.  LKW: 12 PM tpa given?: No, nondisabling symptoms, likely not stroke IR Thrombectomy? No, no sign of LVO Modified Rankin Scale: 0-Completely asymptomatic and back to baseline post- stroke   Exam: Vitals:   05/03/23 1246 05/03/23 1300  BP: 128/84 114/80  Pulse: 63 62  Resp: 13 14  Temp: 98.4 F (36.9 C)   SpO2: 100% 97%     Temp:  [98.4 F (36.9 C)] 98.4 F (36.9 C) (11/09 1246) Pulse Rate:  [62-63] 62 (11/09 1300) Resp:  [13-14] 14 (11/09 1300) BP: (114-128)/(80-84) 114/80 (11/09 1300) SpO2:  [97 %-100 %] 97 % (11/09 1300)  General - Well nourished, well developed, in mild distress due to headache.  Ophthalmologic - fundi not visualized due to noncooperation.  Cardiovascular - Regular rhythm and rate.  Neuro - awake, alert, eyes open, orientated to age, place, time and people. No aphasia, fluent language, following all simple commands. Able to name and repeat and read. No gaze palsy, tracking bilaterally, visual field full, PERRL.  Questionable slight right nasolabial fold flattening by RN. Tongue midline. Bilateral UEs 5/5, no drift, slight grip weakness on the right hand by RN. Bilaterally LEs 5/5, no drift, slight right ankle PF weakness by RN. Sensation symmetrical bilaterally except right forehead V1 and right upper extremity mildly decreased light  touch sensation, b/l FTN and HTS intact, gait not tested.     NIH Stroke Scale = 2 (facial, sensation)   Imaging Reviewed:  CT HEAD CODE STROKE WO CONTRAST  Result Date: 05/03/2023 CLINICAL DATA:  Code stroke. Right-sided headache and right facial weakness EXAM: CT HEAD WITHOUT CONTRAST TECHNIQUE: Contiguous axial images were obtained from the base of the skull through the vertex without intravenous contrast. RADIATION DOSE REDUCTION: This exam was  performed according to the departmental dose-optimization program which includes automated exposure control, adjustment of the mA and/or kV according to patient size and/or use of iterative reconstruction technique. COMPARISON:  None Available. FINDINGS: Brain: No hemorrhage. No hydrocephalus. No extra-axial fluid collection. There is a 9 x 9 mm partially calcified lesion along the left cerebral convexity, favored to represent a meningioma. No CT evidence of an acute cortical infarct. Vascular: No hyperdense vessel or unexpected calcification. Skull: Normal. Negative for fracture or focal lesion. Sinuses/Orbits: No middle ear mastoid effusion. Paranasal sinuses are clear. Orbits are unremarkable. Other: None. ASPECTS (Alberta Stroke Program Early CT Score): 10 IMPRESSION: 1. No hemorrhage or CT evidence of an acute cortical infarct. ASPECTS 10 2. 9 x 9 mm partially calcified lesion along the left cerebral convexity, favored to represent a meningioma. Findings were discussed with Dr. Fredderick Phenix on 05/03/23 at 1:35 PM. Electronically Signed   By: Lorenza Cambridge M.D.   On: 05/03/2023 13:35     Labs reviewed in epic and pertinent values follow: Glucose 79  Assessment:  42 year old female with history of cluster headache, anxiety, gestational diabetes, possible antiphospholipid syndrome presented to ED for headache and right arm numbness.  Patient did have cluster headache 3 AM in the morning but current headache started at 12 PM was different from her typical cluster headache.  Right arm face numbness is improving, however, headache still there.  She had a 2 early pregnancy loss with elevated IgG beta-2 glycoprotein antibody, concerning for APS with lab criteria but not clinical criteria.   Patient symptoms most likely complicated migraine, recommend headache cocktail treatment and Fioricet as needed on discharge.  Agree with EDP given her right neck pain, will do CTA head and neck to rule out dissection.  However, if  she had right ICA dissection, the symptoms will be on the left.  Exam did not support right VA dissection.  Given her history of possible APS, low threshold for MRI brain if needed.  Will repeat beta-2 glycoprotein antibody, and recommend ASA 81 on discharge. She needs close follow-up with Dr. Mosetta Putt oncology.  The left frontal small calcified meningioma on CT likely to be incidental finding.  No evidence for seizure at this time.   Recommendations:  -Headache cocktail for acute migraine treatment -Fioricet as needed in ER and on discharge -CT head and neck -Low threshold for MRI brain if needed -Recheck beta-2 glycoprotein antibody -Close follow-up with Dr. Mosetta Putt oncology. Recommend ASA 81 on discharge from ER. -If headache and numbness improved after headache management, and patient can ambulate in the ER, patient can be discharged from neurostandpoint with close neurology follow-up as outpatient -If no improvement with initial treatment, may consider to transfer to [facility] for MRI study. -Discussed with EDP Dr. Fredderick Phenix -Will follow   Consult Participants: Patient, bedside RN, patient husband, patient daughter and me Location of the provider: [facility] Location of the patient: [facility]  Time Code Stroke Page received: 1:25 PM Time neurologist arrived: 1:26 PM Time NIHSS completed: 1:46 PM  This consult was provided  via telemedicine with 2-way video and audio communication. The patient/family was informed that care would be provided in this way and agreed to receive care in this manner.   This patient is receiving care for possible acute neurological changes. There was 80 minutes of care by this provider at the time of service, including time for direct evaluation via telemedicine, review of medical records, imaging studies and discussion of findings with providers, the patient and/or family.  Marvel Plan, MD PhD Stroke Neurology 05/03/2023 2:01 PM

## 2023-05-03 NOTE — ED Provider Notes (Signed)
42 yo female w/ hx of cluster headaches presenting with acute onset headache, right arm weakness and numbness in right face  Code stroke & neuro evaluation - concern for complex migraine  CTH with no acute findings - meningioma noted CTA head and neck ordered  IV reglan and benadryl given for headache   Physical Exam  BP 100/70   Pulse 72   Temp 98.4 F (36.9 C)   Resp 17   LMP 05/02/2023 (Exact Date)   SpO2 99%   Physical Exam  Procedures  Procedures  ED Course / MDM    Medical Decision Making Amount and/or Complexity of Data Reviewed Labs: ordered. Radiology: ordered.  Risk OTC drugs. Prescription drug management.   430 pm - pt reassessed, she reports complete resolution of her headache and neurological symptoms including numbness.  Hr 80's.  Vitals otherwise normal.  She may have had a dystonic reaction to Reglan, which initially caused tachycardia and discomfort, but those symptoms have since abated.  Per the signout discussion with the ED provider Dr. Fredderick Phenix and review of neurologist note from Dr Roda Shutters, and my discussion with Dr. Roda Shutters, it is reasonable to discharge this patient home with a tentative diagnosis of a complex migraine.  Given that her symptoms have completely resolved, the specialist did not feel that she needed emergent MR imaging of the brain.  The patient is comfortable this plan and is going home where she stays with her husband.  Return precautions were discussed.  She verbalized understanding will follow-up with neurology as an outpatient.  The neurologist also recommends restarting 81 mg daily for antiphospholipid syndrome, which patient verbalized understanding and says she will do so.  She will follow-up with Dr. Mosetta Putt for this issue.  We also prescribed Fioricet as needed for migraine headaches per the recommendations of the neurologist.      Terald Sleeper, MD 05/03/23 1654

## 2023-05-03 NOTE — Consult Note (Signed)
1320 Code stroke cart activated 1322 patient taken to CT 1325 telespecialist paged [phone] Dr Roda Shutters on screen MRS 0  CT results reported by radiologist to provider

## 2023-05-03 NOTE — ED Provider Notes (Addendum)
[place] EMERGENCY DEPARTMENT AT [facility] Provider Note   CSN: [id number] Arrival date & time: 05/03/23  1238     History  Chief Complaint  Patient presents with   Headache   Code Stroke    Misty Russell is a 42 y.o. female.  Patient is a 42 year old who presents with a headache and numbness to her right face.  She does have a history of cluster headaches.  She said she woke up during the night with what she thought was a cluster headache with pain to her right face and around her right eye.  She took an Imitrex and it seemed to go away.  It came back again about an hour prior to arrival.  At that time she had some numbness in her right face.  She also has some numbness and weakness in her right arm.  She said she has had similar type cluster headaches in the past but never this bad and never had neurologic symptoms with them.  No recent head trauma.  No recent fevers or other illnesses.  No speech deficits.  No vision changes.       Home Medications Prior to Admission medications   Medication Sig Start Date End Date Taking? Authorizing Provider  estradiol (ESTRACE VAGINAL) 0.1 MG/GM vaginal cream Place 1 Applicatorful vaginally 3 (three) times a week as directed 01/10/23  Yes   estradiol (ESTRACE VAGINAL) 0.1 MG/GM vaginal cream Insert 1 applicatorful 3 times a week by vaginal route as directed for 30 days. 01/09/23  Yes   nystatin-triamcinolone ointment (MYCOLOG) Apply a small amount to the affected area topically two times daily for 10 days as needed 01/09/23  Yes   Norethindrone Acetate-Ethinyl Estrad-FE (LOESTRIN 24 FE) 1-20 MG-MCG(24) tablet Take 1 tablet by mouth daily. Patient not taking: Reported on 10/06/2019 01/28/18 10/12/19  Genia Del, MD      Allergies    Clobetasol and Phenergan [promethazine]    Review of Systems   Review of Systems  Constitutional:  Negative for chills, diaphoresis, fatigue and fever.  HENT:  Negative for congestion,  rhinorrhea and sneezing.   Eyes: Negative.   Respiratory:  Negative for cough, chest tightness and shortness of breath.   Cardiovascular:  Negative for chest pain and leg swelling.  Gastrointestinal:  Negative for abdominal pain, blood in stool, diarrhea, nausea and vomiting.  Genitourinary:  Negative for difficulty urinating, flank pain, frequency and hematuria.  Musculoskeletal:  Negative for arthralgias and back pain.  Skin:  Negative for rash.  Neurological:  Positive for weakness, numbness and headaches. Negative for dizziness and speech difficulty.    Physical Exam Updated Vital Signs BP 118/88   Pulse (!) 115   Temp 98.4 F (36.9 C)   Resp 18   LMP 05/02/2023 (Exact Date)   SpO2 100%  Physical Exam Constitutional:      Appearance: She is well-developed.  HENT:     Head: Normocephalic and atraumatic.  Eyes:     Pupils: Pupils are equal, round, and reactive to light.  Cardiovascular:     Rate and Rhythm: Normal rate and regular rhythm.     Heart sounds: Normal heart sounds.  Pulmonary:     Effort: Pulmonary effort is normal. No respiratory distress.     Breath sounds: Normal breath sounds. No wheezing or rales.  Chest:     Chest wall: No tenderness.  Abdominal:     General: Bowel sounds are normal.     Palpations: Abdomen is soft.  Tenderness: There is no abdominal tenderness. There is no guarding or rebound.  Musculoskeletal:        General: Normal range of motion.     Cervical back: Normal range of motion and neck supple.  Lymphadenopathy:     Cervical: No cervical adenopathy.  Skin:    General: Skin is warm and dry.     Findings: No rash.  Neurological:     Mental Status: She is alert and oriented to person, place, and time.     Comments: Slight tenseness of the right side of the face without obvious facial droop.  She does have some numbness in the right side the face as compared to the left.  She has some slight weakness in the right arm as compared to  the left.  She has some slight numbness in the right arm as compared to the left.  She has normal motor function and sensation to lower extremities bilaterally.     ED Results / Procedures / Treatments   Labs (all labs ordered are listed, but only abnormal results are displayed) Labs Reviewed  COMPREHENSIVE METABOLIC PANEL - Abnormal; Notable for the following components:      Result Value   Total Protein 8.3 (*)    All other components within normal limits  CBC WITH DIFFERENTIAL/PLATELET - Abnormal; Notable for the following components:   WBC 12.5 (*)    Neutro Abs 10.7 (*)    All other components within normal limits  ETHANOL  RAPID URINE DRUG SCREEN, HOSP PERFORMED  PREGNANCY, URINE  BETA-2-GLYCOPROTEIN I ABS, IGG/M/A  CBG MONITORING, ED    EKG EKG Interpretation Date/Time:  Saturday May 03 2023 14:56:01 EST Ventricular Rate:  104 PR Interval:  115 QRS Duration:  83 QT Interval:  319 QTC Calculation: 420 R Axis:   70  Text Interpretation: Sinus tachycardia Confirmed by Alvester Chou [phone] on 05/03/2023 3:28:42 PM  Radiology CT HEAD CODE STROKE WO CONTRAST  Result Date: 05/03/2023 CLINICAL DATA:  Code stroke. Right-sided headache and right facial weakness EXAM: CT HEAD WITHOUT CONTRAST TECHNIQUE: Contiguous axial images were obtained from the base of the skull through the vertex without intravenous contrast. RADIATION DOSE REDUCTION: This exam was performed according to the departmental dose-optimization program which includes automated exposure control, adjustment of the mA and/or kV according to patient size and/or use of iterative reconstruction technique. COMPARISON:  None Available. FINDINGS: Brain: No hemorrhage. No hydrocephalus. No extra-axial fluid collection. There is a 9 x 9 mm partially calcified lesion along the left cerebral convexity, favored to represent a meningioma. No CT evidence of an acute cortical infarct. Vascular: No hyperdense vessel or  unexpected calcification. Skull: Normal. Negative for fracture or focal lesion. Sinuses/Orbits: No middle ear mastoid effusion. Paranasal sinuses are clear. Orbits are unremarkable. Other: None. ASPECTS (Alberta Stroke Program Early CT Score): 10 IMPRESSION: 1. No hemorrhage or CT evidence of an acute cortical infarct. ASPECTS 10 2. 9 x 9 mm partially calcified lesion along the left cerebral convexity, favored to represent a meningioma. Findings were discussed with Dr. Fredderick Phenix on 05/03/23 at 1:35 PM. Electronically Signed   By: Lorenza Cambridge M.D.   On: 05/03/2023 13:35    Procedures Procedures    Medications Ordered in ED Medications  ondansetron (ZOFRAN) injection 4 mg (4 mg Intravenous Given 05/03/23 1423)  diphenhydrAMINE (BENADRYL) injection 50 mg (50 mg Intravenous Given 05/03/23 1449)  metoCLOPramide (REGLAN) injection 10 mg (10 mg Intravenous Given 05/03/23 1450)  iohexol (OMNIPAQUE) 350 MG/ML injection  75 mL (75 mLs Intravenous Contrast Given 05/03/23 1504)    ED Course/ Medical Decision Making/ A&P                                 Medical Decision Making Amount and/or Complexity of Data Reviewed Labs: ordered. Radiology: ordered.  Risk OTC drugs. Prescription drug management.   Patient is a 42 year old who presents with sudden onset of headache.  She does have a history of cluster headaches.  She does have some mild neurologic deficits.  Given this, code stroke was activated.  She had a CT head which does not show any acute abnormality.  Labs are nonconcerning.  She has been evaluated by neurology who feels that this is likely a complex migraine.  Suggest migraine treatment.  However she does have a history of questionable antiphospholipid syndrome.  Low threshold for MRI if her symptoms have not completely resolved.  She was given dose of Reglan and Benadryl for migraine treatment.  She had a reaction just after that.  She got anxious and tachycardic.  She felt short of breath.  The  symptoms resolved fairly rapidly.  CTA has been ordered to assess her vessels.  Care turned over to Dr. Renaye Rakers pending reassessment and CT results.  Addendum: my initial patient evaluation was at 13:10 and Code Stroke was activated shortly after.  Final Clinical Impression(s) / ED Diagnoses Final diagnoses:  None    Rx / DC Orders ED Discharge Orders     None         Rolan Bucco, MD 05/03/23 1552    Rolan Bucco, MD 05/23/23 [phone]

## 2023-05-03 NOTE — ED Notes (Signed)
Code Tele activated at 1320

## 2023-05-03 NOTE — ED Triage Notes (Signed)
She reports right-sided headache with a feeling like "the right side of my face is weak". She reports having a right-sided h/s at ~0300, at which time she took subQ Imitrex and "that headache went away". She is here now d/t this headache recurring about an hour ago "worse than ever". She also tells me she has a hx of "cluster headaches; and I haven't had one in about two years". Her right hand grip is weaker than left. She has facial symmetry and clear speech.

## 2023-05-03 NOTE — ED Notes (Signed)
CBG 79 

## 2023-05-03 NOTE — Discharge Instructions (Addendum)
You were diagnosed with a possible complex migraine headache, which is a headache that can cause neurological symptoms, or even strokelike symptoms.  Your headache and your symptoms improved with migraine medications.  Your CT scans did not show signs of a stroke or medical emergency at this time.  Our neurologist felt there was reasonable to discharge you home with follow-up in the office with a neurologist for these complicated migraines.  From reviewing your medical history (including your workup antiphospholipid syndrome), the neurologist recommends that we restart you on 81 mg of aspirin, and that you follow-up with Dr. Mosetta Putt in the clinic.  The neurologist also recommended that we prescribe Fioricet which is a medicine that you can use as needed for migraine headaches.

## 2023-05-05 ENCOUNTER — Ambulatory Visit: Payer: 59 | Admitting: Student

## 2023-05-05 ENCOUNTER — Encounter: Payer: Self-pay | Admitting: Student

## 2023-05-05 ENCOUNTER — Telehealth: Payer: Self-pay | Admitting: Hematology

## 2023-05-05 ENCOUNTER — Other Ambulatory Visit (HOSPITAL_COMMUNITY): Payer: Self-pay

## 2023-05-05 VITALS — BP 115/73 | HR 65 | Temp 98.3°F | Ht 64.0 in | Wt 139.9 lb

## 2023-05-05 DIAGNOSIS — D329 Benign neoplasm of meninges, unspecified: Secondary | ICD-10-CM

## 2023-05-05 DIAGNOSIS — R519 Headache, unspecified: Secondary | ICD-10-CM

## 2023-05-05 DIAGNOSIS — F419 Anxiety disorder, unspecified: Secondary | ICD-10-CM

## 2023-05-05 MED ORDER — ESCITALOPRAM OXALATE 10 MG PO TABS
10.0000 mg | ORAL_TABLET | Freq: Every day | ORAL | 2 refills | Status: DC
  Filled 2023-05-05: qty 30, 30d supply, fill #0
  Filled 2023-10-22: qty 30, 30d supply, fill #1
  Filled 2023-12-03: qty 30, 30d supply, fill #2

## 2023-05-05 MED ORDER — BUTALBITAL-APAP-CAFFEINE 50-325-40 MG PO TABS
1.0000 | ORAL_TABLET | Freq: Three times a day (TID) | ORAL | 2 refills | Status: DC | PRN
  Filled 2023-05-05: qty 30, 5d supply, fill #0

## 2023-05-05 NOTE — Assessment & Plan Note (Signed)
Patient has history of anxiety which she had to take Vistaril during her pregnancy.  Since the diagnosis of her suspected meningioma, she feels like her anxiety has worsened.  PHQ-9 today is 1 and GAD-7 is 8.  She notes she has taken Zoloft in the past and did not like how it made her feel.  She has also taken Prozac which made her have a brain fog.  We will trial Lexapro and follow-up with 4 weeks. - Begin Lexapro

## 2023-05-05 NOTE — Assessment & Plan Note (Signed)
Patient went to ED on 11/9 for severe headache around her right eye at 3 AM in morning.  She took Imitrex at home and the headache resolved.  At 12 PM later that day, the headache came back and was located in the right frontal area and back of head which was different from her prior cluster headaches. Tylenol didn't resolve the pain.  She felt numbness on her right face and arm and was given a stroke workup which noted an incidental meningioma on the CT. Per neuro, they believe the left frontal calcified mengioma on CT was likely incidental. She is scheduled to follow-up in Feb. She was given Fioricet at discharge and has had to use this 2 times in Saturday.  She notes that Fioricet has been successful with resolving her signs and symptoms.  She denies any photophobia, vomiting, auras, but slight nausea with her headaches.  She says that headaches will wake her up from her sleep and only happen at night.  As there was a low threshold for follow-up MRI, we will place a referral as patient remains symptomatic. - Continue Fioricet as needed for headaches - Follow-up with neuro in February - Continue aspirin - Referral for MRI head
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
## 2023-05-05 NOTE — Patient Instructions (Addendum)
Thank you so much for coming to the clinic today!   I have sent in an order for your Lexapro and Fiorcet.   I have also placed an order for an MRI. You should receive a call regarding the schedule in the coming days.   If you have any questions please feel free to the call the clinic at anytime at [phone]. It was a pleasure seeing you!  Best, Dr. Rayvon Char

## 2023-05-06 ENCOUNTER — Ambulatory Visit: Payer: Self-pay | Admitting: Neurology

## 2023-05-06 LAB — BETA-2-GLYCOPROTEIN I ABS, IGG/M/A
Beta-2 Glyco I IgG: 45 GPI IgG units — ABNORMAL HIGH (ref 0–20)
Beta-2-Glycoprotein I IgA: <9 GPI IgA units (ref 0–25)
Beta-2-Glycoprotein I IgM: <9 GPI IgM units (ref 0–32)

## 2023-05-07 NOTE — Progress Notes (Signed)
Internal Medicine Clinic Attending  I was physically present during the key portions of the resident provided service and participated in the medical decision making of patient's management care. I reviewed pertinent patient test results.  The assessment, diagnosis, and plan were formulated together and I agree with the documentation in the resident's note.  I think given her headaches are ongoing and not similar to her migraines in the past, it would be prudent to get improved imaging with an MRI.  Overall, I do not anticipate this will show a new diagnosis, but may give a better image/dimensions of the meningioma to further guide care.   Inez Catalina, MD

## 2023-05-08 ENCOUNTER — Encounter: Payer: Self-pay | Admitting: Neurology

## 2023-05-08 ENCOUNTER — Ambulatory Visit: Payer: 59 | Admitting: Neurology

## 2023-05-08 ENCOUNTER — Other Ambulatory Visit (HOSPITAL_COMMUNITY): Payer: Self-pay

## 2023-05-08 VITALS — BP 113/69 | HR 56 | Ht 64.0 in | Wt 140.0 lb

## 2023-05-08 DIAGNOSIS — Z9189 Other specified personal risk factors, not elsewhere classified: Secondary | ICD-10-CM | POA: Diagnosis not present

## 2023-05-08 DIAGNOSIS — Z820 Family history of epilepsy and other diseases of the nervous system: Secondary | ICD-10-CM | POA: Diagnosis not present

## 2023-05-08 DIAGNOSIS — Z8669 Personal history of other diseases of the nervous system and sense organs: Secondary | ICD-10-CM

## 2023-05-08 DIAGNOSIS — R0683 Snoring: Secondary | ICD-10-CM | POA: Diagnosis not present

## 2023-05-08 DIAGNOSIS — R519 Headache, unspecified: Secondary | ICD-10-CM

## 2023-05-08 DIAGNOSIS — G43909 Migraine, unspecified, not intractable, without status migrainosus: Secondary | ICD-10-CM | POA: Diagnosis not present

## 2023-05-08 MED ORDER — RIZATRIPTAN BENZOATE 10 MG PO TABS
10.0000 mg | ORAL_TABLET | ORAL | 1 refills | Status: AC | PRN
  Filled 2023-05-08: qty 10, 21d supply, fill #0
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
## 2023-05-08 NOTE — Patient Instructions (Signed)
It was nice to meet you today. Your neurological exam is nonfocal thankfully. I recommend you proceed with a brain MRI as ordered by your PCP. We will proceed with a home sleep test to see if you have underlying obstructive sleep apnea.  If you have obstructive sleep apnea I will likely recommend treatment with an AutoPap machine. Please start to stay well-hydrated and well rested.  Headaches can be triggered by stress, sleep deprivation, blood sugar fluctuation, dehydration. Please try Maxalt 10 mg strength which is a different triptan from Imitrex, do not combine with Imitrex.  Take 1 pill at the onset of a migraine, may repeat after 2 hours, no more than 2 pills in 24 hours, no more than 3 pills a week.  I would not recommend you take Fioricet as it can cause rebound headaches and may be addicting.

## 2023-05-09 ENCOUNTER — Other Ambulatory Visit: Payer: Self-pay

## 2023-05-09 DIAGNOSIS — D509 Iron deficiency anemia, unspecified: Secondary | ICD-10-CM

## 2023-05-09 DIAGNOSIS — D329 Benign neoplasm of meninges, unspecified: Secondary | ICD-10-CM

## 2023-05-11 DIAGNOSIS — R760 Raised antibody titer: Secondary | ICD-10-CM | POA: Insufficient documentation

## 2023-05-11 NOTE — Assessment & Plan Note (Signed)
-  Patient has G4P2 with 2 early pregnancy losses before 7 weeks, her Ob obtained hypercoagulable work up that showed elevated beta2-glycoprotein IgG, otherwise negative. She did have 2 full term pregnancies and delivery without incident.  -She has no h/o thrombosis, no pregnancy loss after 10 weeks. Her repeat Beta-2-glycoprotein antibody IgG remains elevated on 01/03/20. She does meet lab criteria for antiphospholipid syndrome but not clinical criteria. -She will continue baby aspirin to help reduce risk of thrombosis.  I also recommend full dose anticoagulation during pregnancy and postpartum, and perioperative period due to risk of thrombosis.

## 2023-05-12 ENCOUNTER — Inpatient Hospital Stay: Payer: 59 | Attending: Hematology | Admitting: Hematology

## 2023-05-12 ENCOUNTER — Inpatient Hospital Stay: Payer: 59

## 2023-05-12 ENCOUNTER — Other Ambulatory Visit: Payer: Self-pay

## 2023-05-12 ENCOUNTER — Other Ambulatory Visit (HOSPITAL_COMMUNITY): Payer: Self-pay

## 2023-05-12 ENCOUNTER — Encounter: Payer: Self-pay | Admitting: Hematology

## 2023-05-12 VITALS — BP 102/61 | HR 63 | Temp 97.9°F | Resp 17 | Wt 143.2 lb

## 2023-05-12 DIAGNOSIS — D6861 Antiphospholipid syndrome: Secondary | ICD-10-CM | POA: Diagnosis not present

## 2023-05-12 DIAGNOSIS — Z7982 Long term (current) use of aspirin: Secondary | ICD-10-CM | POA: Diagnosis not present

## 2023-05-12 DIAGNOSIS — D509 Iron deficiency anemia, unspecified: Secondary | ICD-10-CM

## 2023-05-12 DIAGNOSIS — R760 Raised antibody titer: Secondary | ICD-10-CM | POA: Diagnosis not present

## 2023-05-12 LAB — CBC WITH DIFFERENTIAL (CANCER CENTER ONLY)
Abs Immature Granulocytes: 0 10*3/uL (ref 0.00–0.07)
Basophils Absolute: 0 10*3/uL (ref 0.0–0.1)
Basophils Relative: 1 %
Eosinophils Absolute: 0.1 10*3/uL (ref 0.0–0.5)
Eosinophils Relative: 2 %
HCT: 39.6 % (ref 36.0–46.0)
Hemoglobin: 12.9 g/dL (ref 12.0–15.0)
Immature Granulocytes: 0 %
Lymphocytes Relative: 42 %
Lymphs Abs: 1.9 10*3/uL (ref 0.7–4.0)
MCH: 29.3 pg (ref 26.0–34.0)
MCHC: 32.6 g/dL (ref 30.0–36.0)
MCV: 90 fL (ref 80.0–100.0)
Monocytes Absolute: 0.2 10*3/uL (ref 0.1–1.0)
Monocytes Relative: 5 %
Neutro Abs: 2.3 10*3/uL (ref 1.7–7.7)
Neutrophils Relative %: 50 %
Platelet Count: 292 10*3/uL (ref 150–400)
RBC: 4.4 MIL/uL (ref 3.87–5.11)
RDW: 13.7 % (ref 11.5–15.5)
WBC Count: 4.6 10*3/uL (ref 4.0–10.5)
nRBC: 0 % (ref 0.0–0.2)

## 2023-05-12 LAB — CMP (CANCER CENTER ONLY)
ALT: 17 U/L (ref 0–44)
AST: 20 U/L (ref 15–41)
Albumin: 4.4 g/dL (ref 3.5–5.0)
Alkaline Phosphatase: 69 U/L (ref 38–126)
Anion gap: 5 (ref 5–15)
BUN: 13 mg/dL (ref 6–20)
CO2: 29 mmol/L (ref 22–32)
Calcium: 9 mg/dL (ref 8.9–10.3)
Chloride: 104 mmol/L (ref 98–111)
Creatinine: 0.79 mg/dL (ref 0.44–1.00)
GFR, Estimated: >60 mL/min (ref >60)
Glucose, Bld: 88 mg/dL (ref 70–99)
Potassium: 3.9 mmol/L (ref 3.5–5.1)
Sodium: 138 mmol/L (ref 135–145)
Total Bilirubin: 0.5 mg/dL (ref <1.2)
Total Protein: 7.7 g/dL (ref 6.5–8.1)

## 2023-05-12 LAB — FERRITIN: Ferritin: 41 ng/mL (ref 11–307)
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
## 2023-05-15 ENCOUNTER — Other Ambulatory Visit: Payer: Self-pay | Admitting: Neurology

## 2023-05-15 ENCOUNTER — Other Ambulatory Visit (HOSPITAL_COMMUNITY): Payer: Self-pay

## 2023-05-15 ENCOUNTER — Encounter: Payer: Self-pay | Admitting: Neurology

## 2023-05-15 MED ORDER — SUMATRIPTAN SUCCINATE 4 MG/0.5ML ~~LOC~~ SOAJ
4.0000 mg | Freq: Once | SUBCUTANEOUS | 3 refills | Status: DC | PRN
  Filled 2023-05-15: qty 3, 6d supply, fill #0

## 2023-05-15 MED ORDER — SUMATRIPTAN SUCCINATE 6 MG/0.5ML ~~LOC~~ SOAJ
6.0000 mg | Freq: Once | SUBCUTANEOUS | 1 refills | Status: DC | PRN
  Filled 2023-05-15: qty 3, 30d supply, fill #0

## 2023-05-15 NOTE — Telephone Encounter (Signed)
Imitrex injection sent to pharm.

## 2023-05-16 ENCOUNTER — Encounter: Payer: Self-pay | Admitting: Neurology

## 2023-05-16 ENCOUNTER — Telehealth: Payer: Self-pay | Admitting: Neurology

## 2023-05-16 ENCOUNTER — Other Ambulatory Visit: Payer: Self-pay | Admitting: Neurology

## 2023-05-16 ENCOUNTER — Other Ambulatory Visit (HOSPITAL_COMMUNITY): Payer: Self-pay

## 2023-05-16 MED ORDER — TOPIRAMATE 25 MG PO TABS: 25.0000 mg | ORAL_TABLET | Freq: Two times a day (BID) | ORAL | 6 refills | Status: DC

## 2023-05-16 MED ORDER — TOPIRAMATE 25 MG PO TABS
25.0000 mg | ORAL_TABLET | Freq: Two times a day (BID) | ORAL | 6 refills | Status: DC
  Filled 2023-05-16: qty 60, 30d supply, fill #0
  Filled 2023-09-26 (×2): qty 60, 30d supply, fill #1
  Filled 2024-01-08: qty 60, 30d supply, fill #2
  Filled 2024-03-23: qty 60, 30d supply, fill #3

## 2023-05-16 MED ORDER — PREDNISONE 20 MG PO TABS
ORAL_TABLET | ORAL | 0 refills | Status: AC
  Filled 2023-05-16: qty 40, 14d supply, fill #0

## 2023-05-16 NOTE — Telephone Encounter (Signed)
Patient paged on-call physician.  I have called her once already and left a voicemail.  Will try again now.  But briefly she was seen by Dr. Frances Furbish for headaches.  She has a history of migraines and cluster headaches.  She has been on verapamil in the past as well as topiramate.  Her blood pressure and pulse were low at last appointment so I would hesitate, if this is a cluster headache, to start her on verapamil.  Topiramate would be a good choice, I will discuss with patient.  For cluster headaches the quickest relief is prednisone however we have to start very high at 100 mg and taper over 2 weeks for cluster headaches, requires a higher dose.  Her most recent hemoglobin A1c was 5.8 so prediabetes and I would suggest monitoring glucose.  It is very difficult to get oxygen approved for cluster headaches and usually we cannot.  I would recommend trying topiramate and giving her a 2-week taper of steroids.  Dr. Frances Furbish called in Imitrex injections yesterday, make sure she is taking those right at onset.  Dr. Frances Furbish may consider trying Emgality for episodic cluster headaches, 300 mg once monthly for several months or until the cluster period is done.  Spoke with patient:She states these are clusters, at night, typical for her clusters. Last night had one, she took injections and went away then came back was severe. Pain throbbing around the eye with autonomic symptoms. She used an old 6mg  injection may be less effective expired 02/2023 but she is going to pick up the new imitrex 6mg  IM.  Advised that she should pick up the new medications which she will and not take the old ones but I do understand cluster headaches are so severely painful she was in a very bad position.  Discussed with lovely patient.  I think verapamil given her systolic of 113 and pulse of 56, would not be appropriate although it is first-line for cluster headaches.  I will prescribe her Topamax 25 mg twice a day and did discuss symptoms/side  effects and not to get pregnant.  She also agreed to start the prednisone, I discussed her hemoglobin A1c was 5.8, watch for hyperglycemia, but that a prednisone taper over 2 weeks is probably the quickest way we can address her cluster headaches at this time.  I did discuss that with clusters we have to started on 100 mg and taper down, she understood and agreed.  I advised her to take the Imitrex injection at onset of cluster headache and that she can repeat it in 2 hours or more if needed, max twice daily.  And I recommend that Dr. Frances Furbish try to get her Emgality 300 mg monthly for her episodic cluster headaches, used only for several months around her cluster headache cycles.  I can discuss with Dr. Frances Furbish on Monday and if Dr. Frances Furbish would like me to take over care and happy to do so as well.  Misty Russell

## 2023-05-17 ENCOUNTER — Other Ambulatory Visit: Payer: Self-pay | Admitting: Neurology

## 2023-05-17 DIAGNOSIS — G44019 Episodic cluster headache, not intractable: Secondary | ICD-10-CM

## 2023-05-17 MED ORDER — ZEMBRACE SYMTOUCH 3 MG/0.5ML ~~LOC~~ SOAJ: SUBCUTANEOUS | 0 refills | Status: AC

## 2023-05-17 MED ORDER — ZEMBRACE SYMTOUCH 3 MG/0.5ML ~~LOC~~ SOAJ: SUBCUTANEOUS | Status: DC

## 2023-05-19 NOTE — Telephone Encounter (Signed)
Dr Lucia Gaskins spoke with the patient after pt called on-call MD. It has been documented in other encounter.

## 2023-05-19 NOTE — Telephone Encounter (Signed)
Thank you, Dr. Lucia Gaskins for taking the time to help with management of her migraines/cluster headaches.  I agree with the course of action.  She can follow-up as scheduled with me for now and I appreciate the offer help with her care in the future. I will see how it goes with her current management and consult with you as needed.

## 2023-05-29 ENCOUNTER — Telehealth: Payer: Self-pay

## 2023-05-29 NOTE — Telephone Encounter (Signed)
Called patient to schedule HST that Dr. Frances Furbish has ordered. Pt states that her headaches have improved for now. She would like to hold off scheduling at this time. Pt states she will call the sleep lab back if this changes.

## 2023-07-02 ENCOUNTER — Ambulatory Visit (HOSPITAL_COMMUNITY): Payer: Commercial Managed Care - PPO | Attending: Internal Medicine

## 2023-07-17 ENCOUNTER — Other Ambulatory Visit (HOSPITAL_COMMUNITY): Payer: Self-pay

## 2023-07-17 DIAGNOSIS — L218 Other seborrheic dermatitis: Secondary | ICD-10-CM | POA: Diagnosis not present

## 2023-07-17 MED ORDER — FLUOCINONIDE 0.05 % EX SOLN
1.0000 | Freq: Every day | CUTANEOUS | 3 refills | Status: AC
  Filled 2023-07-17: qty 60, 30d supply, fill #0

## 2023-08-13 ENCOUNTER — Ambulatory Visit: Payer: Self-pay | Admitting: Neurology

## 2023-08-14 ENCOUNTER — Ambulatory Visit: Payer: Commercial Managed Care - PPO | Admitting: Neurology

## 2023-09-11 DIAGNOSIS — N760 Acute vaginitis: Secondary | ICD-10-CM | POA: Diagnosis not present

## 2023-09-26 ENCOUNTER — Other Ambulatory Visit (HOSPITAL_COMMUNITY): Payer: Self-pay

## 2023-10-09 ENCOUNTER — Ambulatory Visit: Admitting: Family Medicine

## 2023-10-09 ENCOUNTER — Encounter: Payer: Self-pay | Admitting: Family Medicine

## 2023-10-09 VITALS — BP 120/72 | HR 77 | Temp 97.7°F | Resp 18 | Wt 136.8 lb

## 2023-10-09 DIAGNOSIS — F419 Anxiety disorder, unspecified: Secondary | ICD-10-CM

## 2023-10-09 DIAGNOSIS — R198 Other specified symptoms and signs involving the digestive system and abdomen: Secondary | ICD-10-CM | POA: Diagnosis not present

## 2023-10-09 DIAGNOSIS — F4321 Adjustment disorder with depressed mood: Secondary | ICD-10-CM | POA: Diagnosis not present

## 2023-10-09 NOTE — Progress Notes (Signed)
 Established Patient Office Visit   Subjective:  Patient ID: Misty Russell, female    DOB: March 08, 1981  Age: 43 y.o. MRN: [id number]  Chief Complaint  Patient presents with   GI Problem    Pt C/O of bowel movement shape and size for 2 weeks; with no abdominal pain or nausea. Pt took Miralax 2 days ago and have been experiencing diarrhea; last BM was Tuesday. Pt has a decrease in appetite due to loss of family member and work with depression and anxiety. Pt voiced concerns with possible colon cancer.     GI Problem Primary symptoms do not include abdominal pain, diarrhea, melena, myalgias or rash.  The illness does not include constipation.   Encounter Diagnoses  Name Primary?   Grieving Yes   Anxiety    Change in bowel movement    Presents with a 2-week history of a change in her bowel movements.  The diameter of her stool has lessened.  Stool is otherwise the same.  Stooling with the same frequency about every other day.  Same color or brown.  There has been no blood or pus in her stool.  There has been no significant weight loss.  Denies night sweats. Of most significance her father passed 1 week ago.  It sounds as though he had aspirated and developed cardiac arrest while at home.  911 was called.  He passed at the hospital later that day.  She recently restarted her Lexapro 10 mg 1 week ago.  Her father's funeral will be on Tuesday.   Review of Systems  Constitutional: Negative.   HENT: Negative.    Eyes:  Negative for blurred vision, discharge and redness.  Respiratory: Negative.    Cardiovascular: Negative.   Gastrointestinal:  Negative for abdominal pain, blood in stool, constipation, diarrhea and melena.  Genitourinary: Negative.   Musculoskeletal: Negative.  Negative for myalgias.  Skin:  Negative for rash.  Neurological:  Negative for tingling, loss of consciousness and weakness.  Endo/Heme/Allergies:  Negative for polydipsia.  Psychiatric/Behavioral:  Negative for  suicidal ideas. The patient is nervous/anxious.      Current Outpatient Medications:    aspirin EC 81 MG tablet, 1 tablet Orally Once a day, Disp: , Rfl:    escitalopram (LEXAPRO) 10 MG tablet, Take 1 tablet (10 mg total) by mouth daily., Disp: 30 tablet, Rfl: 2   estradiol (ESTRACE VAGINAL) 0.1 MG/GM vaginal cream, Place 1 Applicatorful vaginally 3 (three) times a week as directed, Disp: 42.5 g, Rfl: 0   estradiol (ESTRACE VAGINAL) 0.1 MG/GM vaginal cream, Insert 1 applicatorful 3 times a week by vaginal route as directed for 30 days., Disp: 42.5 g, Rfl: 0   fluocinonide (LIDEX) 0.05 % external solution, Apply a thin layer once daily for 1-2 weeks to scalp. Then for maintenance, may use 2-3 times weekly, Disp: 60 mL, Rfl: 3   metroNIDAZOLE (METROGEL) 0.75 % vaginal gel, 1 applicatorful at bedtime Vaginal bedtime for 5 days, Disp: , Rfl:    nystatin-triamcinolone ointment (MYCOLOG), Apply a small amount to the affected area topically two times daily for 10 days as needed, Disp: 30 g, Rfl: 0   rizatriptan (MAXALT) 10 MG tablet, Take 1 tablet (10 mg total) by mouth as needed for migraine. May repeat in 2 hours as needed, no more than 2 tabs/24 hours, no more than 3 tabs/week., Disp: 10 tablet, Rfl: 1   SUMAtriptan Succinate (ZEMBRACE SYMTOUCH) 3 MG/0.5ML SOAJ, May take up to 4 a day as long  as 1 hour inbetween each injection., Disp: 2 mL, Rfl: 0   topiramate (TOPAMAX) 25 MG tablet, Take 1 tablet (25 mg total) by mouth 2 (two) times daily., Disp: 60 tablet, Rfl: 6   Objective:     BP 120/72 (BP Location: Left Arm, Patient Position: Sitting, Cuff Size: Normal)   Pulse 77   Temp 97.7 F (36.5 C) (Temporal)   Resp 18   Wt 136 lb 12.8 oz (62.1 kg)   LMP 09/24/2023 (Exact Date)   SpO2 100%   BMI 23.48 kg/m    Physical Exam Constitutional:      General: She is not in acute distress.    Appearance: Normal appearance. She is not ill-appearing, toxic-appearing or diaphoretic.  HENT:     Head:  Normocephalic and atraumatic.     Right Ear: External ear normal.     Left Ear: External ear normal.  Eyes:     General: No scleral icterus.       Right eye: No discharge.        Left eye: No discharge.     Extraocular Movements: Extraocular movements intact.     Conjunctiva/sclera: Conjunctivae normal.  Pulmonary:     Effort: Pulmonary effort is normal. No respiratory distress.  Skin:    General: Skin is warm and dry.  Neurological:     Mental Status: She is alert and oriented to person, place, and time.  Psychiatric:        Mood and Affect: Mood normal.        Behavior: Behavior normal.      No results found for any visits on 10/09/23.    The 10-year ASCVD risk score (Arnett DK, et al., 2019) is: 0.9%    Assessment & Plan:   Grieving  Anxiety  Change in bowel movement    Return in about 5 weeks (around 11/13/2023).  Continue Lexapro.  Recommended reassurance.  Spent over 30 minutes reviewing the chart and seeing the patient.  Tonna Frederic, MD

## 2023-10-14 ENCOUNTER — Ambulatory Visit: Admitting: Family Medicine

## 2023-10-22 ENCOUNTER — Other Ambulatory Visit (HOSPITAL_COMMUNITY): Payer: Self-pay

## 2023-11-19 DIAGNOSIS — F4312 Post-traumatic stress disorder, chronic: Secondary | ICD-10-CM | POA: Diagnosis not present

## 2023-11-19 DIAGNOSIS — F411 Generalized anxiety disorder: Secondary | ICD-10-CM | POA: Diagnosis not present

## 2023-11-19 DIAGNOSIS — F331 Major depressive disorder, recurrent, moderate: Secondary | ICD-10-CM | POA: Diagnosis not present

## 2023-11-20 ENCOUNTER — Other Ambulatory Visit (HOSPITAL_COMMUNITY): Payer: Self-pay

## 2023-11-20 MED ORDER — LORAZEPAM 0.5 MG PO TABS
0.5000 mg | ORAL_TABLET | Freq: Every day | ORAL | 0 refills | Status: AC | PRN
  Filled 2023-11-20: qty 30, 30d supply, fill #0

## 2023-12-03 ENCOUNTER — Other Ambulatory Visit (HOSPITAL_COMMUNITY): Payer: Self-pay

## 2023-12-19 ENCOUNTER — Other Ambulatory Visit (HOSPITAL_BASED_OUTPATIENT_CLINIC_OR_DEPARTMENT_OTHER): Payer: Self-pay

## 2023-12-19 ENCOUNTER — Other Ambulatory Visit (HOSPITAL_COMMUNITY): Payer: Self-pay

## 2023-12-19 DIAGNOSIS — F331 Major depressive disorder, recurrent, moderate: Secondary | ICD-10-CM | POA: Diagnosis not present

## 2023-12-19 DIAGNOSIS — F4312 Post-traumatic stress disorder, chronic: Secondary | ICD-10-CM | POA: Diagnosis not present

## 2023-12-19 DIAGNOSIS — F411 Generalized anxiety disorder: Secondary | ICD-10-CM | POA: Diagnosis not present

## 2023-12-19 MED ORDER — ESCITALOPRAM OXALATE 20 MG PO TABS
30.0000 mg | ORAL_TABLET | Freq: Every day | ORAL | 0 refills | Status: DC
  Filled 2023-12-19: qty 45, 30d supply, fill #0

## 2024-01-08 ENCOUNTER — Other Ambulatory Visit (HOSPITAL_COMMUNITY): Payer: Self-pay

## 2024-01-19 DIAGNOSIS — N63 Unspecified lump in unspecified breast: Secondary | ICD-10-CM | POA: Diagnosis not present

## 2024-02-13 ENCOUNTER — Other Ambulatory Visit (HOSPITAL_COMMUNITY): Payer: Self-pay

## 2024-02-13 MED ORDER — ESCITALOPRAM OXALATE 20 MG PO TABS
30.0000 mg | ORAL_TABLET | Freq: Every day | ORAL | 0 refills | Status: DC
  Filled 2024-02-13: qty 45, 30d supply, fill #0

## 2024-02-24 ENCOUNTER — Other Ambulatory Visit (HOSPITAL_COMMUNITY): Payer: Self-pay

## 2024-02-24 DIAGNOSIS — F331 Major depressive disorder, recurrent, moderate: Secondary | ICD-10-CM | POA: Diagnosis not present

## 2024-02-24 DIAGNOSIS — F4312 Post-traumatic stress disorder, chronic: Secondary | ICD-10-CM | POA: Diagnosis not present

## 2024-02-24 DIAGNOSIS — F411 Generalized anxiety disorder: Secondary | ICD-10-CM | POA: Diagnosis not present

## 2024-02-24 MED ORDER — BUSPIRONE HCL 5 MG PO TABS
5.0000 mg | ORAL_TABLET | Freq: Three times a day (TID) | ORAL | 0 refills | Status: DC
  Filled 2024-02-24: qty 90, 30d supply, fill #0

## 2024-02-24 MED ORDER — ESCITALOPRAM OXALATE 20 MG PO TABS
20.0000 mg | ORAL_TABLET | Freq: Every day | ORAL | 0 refills | Status: DC
  Filled 2024-02-24 – 2024-04-07 (×2): qty 30, 30d supply, fill #0

## 2024-02-25 ENCOUNTER — Other Ambulatory Visit (HOSPITAL_COMMUNITY): Payer: Self-pay

## 2024-02-25 MED ORDER — RIFAMPIN 300 MG PO CAPS
600.0000 mg | ORAL_CAPSULE | Freq: Every day | ORAL | 0 refills | Status: AC
  Filled 2024-02-25: qty 8, 4d supply, fill #0

## 2024-02-26 DIAGNOSIS — Z01419 Encounter for gynecological examination (general) (routine) without abnormal findings: Secondary | ICD-10-CM | POA: Diagnosis not present

## 2024-02-26 DIAGNOSIS — Z1331 Encounter for screening for depression: Secondary | ICD-10-CM | POA: Diagnosis not present

## 2024-02-26 DIAGNOSIS — Z113 Encounter for screening for infections with a predominantly sexual mode of transmission: Secondary | ICD-10-CM | POA: Diagnosis not present

## 2024-02-26 DIAGNOSIS — Z01411 Encounter for gynecological examination (general) (routine) with abnormal findings: Secondary | ICD-10-CM | POA: Diagnosis not present

## 2024-02-26 DIAGNOSIS — Z124 Encounter for screening for malignant neoplasm of cervix: Secondary | ICD-10-CM | POA: Diagnosis not present

## 2024-02-26 DIAGNOSIS — Z0142 Encounter for cervical smear to confirm findings of recent normal smear following initial abnormal smear: Secondary | ICD-10-CM | POA: Diagnosis not present

## 2024-03-23 ENCOUNTER — Other Ambulatory Visit (HOSPITAL_COMMUNITY): Payer: Self-pay

## 2024-03-23 DIAGNOSIS — F411 Generalized anxiety disorder: Secondary | ICD-10-CM | POA: Diagnosis not present

## 2024-03-23 DIAGNOSIS — F4312 Post-traumatic stress disorder, chronic: Secondary | ICD-10-CM | POA: Diagnosis not present

## 2024-03-23 DIAGNOSIS — F331 Major depressive disorder, recurrent, moderate: Secondary | ICD-10-CM | POA: Diagnosis not present

## 2024-03-23 MED ORDER — ESCITALOPRAM OXALATE 20 MG PO TABS
20.0000 mg | ORAL_TABLET | Freq: Every day | ORAL | 0 refills | Status: DC
  Filled 2024-03-23: qty 90, 90d supply, fill #0

## 2024-03-23 MED ORDER — BUSPIRONE HCL 5 MG PO TABS
5.0000 mg | ORAL_TABLET | Freq: Every day | ORAL | 0 refills | Status: AC
  Filled 2024-03-23 – 2024-04-29 (×2): qty 90, 90d supply, fill #0

## 2024-04-01 ENCOUNTER — Other Ambulatory Visit (HOSPITAL_COMMUNITY): Payer: Self-pay

## 2024-04-04 ENCOUNTER — Emergency Department (HOSPITAL_BASED_OUTPATIENT_CLINIC_OR_DEPARTMENT_OTHER)

## 2024-04-04 ENCOUNTER — Other Ambulatory Visit: Payer: Self-pay

## 2024-04-04 ENCOUNTER — Encounter (HOSPITAL_BASED_OUTPATIENT_CLINIC_OR_DEPARTMENT_OTHER): Payer: Self-pay | Admitting: Emergency Medicine

## 2024-04-04 ENCOUNTER — Emergency Department (HOSPITAL_BASED_OUTPATIENT_CLINIC_OR_DEPARTMENT_OTHER)
Admission: EM | Admit: 2024-04-04 | Discharge: 2024-04-05 | Disposition: A | Discharge status: Home / Self Care | Attending: Emergency Medicine | Admitting: Emergency Medicine

## 2024-04-04 DIAGNOSIS — Z7982 Long term (current) use of aspirin: Secondary | ICD-10-CM | POA: Diagnosis not present

## 2024-04-04 DIAGNOSIS — R0789 Other chest pain: Secondary | ICD-10-CM | POA: Diagnosis present

## 2024-04-04 DIAGNOSIS — R002 Palpitations: Secondary | ICD-10-CM | POA: Diagnosis not present

## 2024-04-04 DIAGNOSIS — R079 Chest pain, unspecified: Secondary | ICD-10-CM | POA: Diagnosis not present

## 2024-04-04 DIAGNOSIS — R072 Precordial pain: Secondary | ICD-10-CM | POA: Diagnosis not present

## 2024-04-04 LAB — CBC
HCT: 37.3 % (ref 36.0–46.0)
Hemoglobin: 12.2 g/dL (ref 12.0–15.0)
MCH: 29.5 pg (ref 26.0–34.0)
MCHC: 32.7 g/dL (ref 30.0–36.0)
MCV: 90.1 fL (ref 80.0–100.0)
Platelets: 269 K/uL (ref 150–400)
RBC: 4.14 MIL/uL (ref 3.87–5.11)
RDW: 14.2 % (ref 11.5–15.5)
WBC: 8.3 K/uL (ref 4.0–10.5)
nRBC: 0 % (ref 0.0–0.2)

## 2024-04-04 LAB — LIPASE, BLOOD: Lipase: 38 U/L (ref 11–51)

## 2024-04-04 LAB — HEPATIC FUNCTION PANEL
ALT: 24 U/L (ref 0–44)
AST: 27 U/L (ref 15–41)
Albumin: 4.3 g/dL (ref 3.5–5.0)
Alkaline Phosphatase: 85 U/L (ref 38–126)
Bilirubin, Direct: 0.2 mg/dL (ref 0.0–0.2)
Indirect Bilirubin: 0.3 mg/dL (ref 0.3–0.9)
Total Bilirubin: 0.5 mg/dL (ref 0.0–1.2)
Total Protein: 7.5 g/dL (ref 6.5–8.1)

## 2024-04-04 LAB — BASIC METABOLIC PANEL WITH GFR
Anion gap: 10 (ref 5–15)
BUN: 10 mg/dL (ref 6–20)
CO2: 24 mmol/L (ref 22–32)
Calcium: 9.3 mg/dL (ref 8.9–10.3)
Chloride: 107 mmol/L (ref 98–111)
Creatinine, Ser: 0.97 mg/dL (ref 0.44–1.00)
GFR, Estimated: >60 mL/min (ref >60)
Glucose, Bld: 130 mg/dL — ABNORMAL HIGH (ref 70–99)
Potassium: 3.9 mmol/L (ref 3.5–5.1)
Sodium: 141 mmol/L (ref 135–145)

## 2024-04-04 LAB — D-DIMER, QUANTITATIVE: D-Dimer, Quant: 0.42 ug{FEU}/mL (ref 0.00–0.50)

## 2024-04-04 LAB — PREGNANCY, URINE: Preg Test, Ur: NEGATIVE

## 2024-04-04 LAB — TROPONIN T, HIGH SENSITIVITY
Troponin T High Sensitivity: <15 ng/L (ref 0–19)
Troponin T High Sensitivity: <15 ng/L (ref 0–19)

## 2024-04-04 MED ORDER — ALUM & MAG HYDROXIDE-SIMETH 200-200-20 MG/5ML PO SUSP
30.0000 mL | Freq: Once | ORAL | Status: AC
  Administered 2024-04-04: 30 mL via ORAL
  Filled 2024-04-04: qty 30

## 2024-04-04 MED ORDER — LIDOCAINE VISCOUS HCL 2 % MT SOLN
15.0000 mL | Freq: Once | OROMUCOSAL | Status: AC
  Administered 2024-04-04: 15 mL via ORAL
  Filled 2024-04-04: qty 15
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
## 2024-04-04 NOTE — ED Triage Notes (Signed)
 Central chest pain x 1.5 weeks. Progressed to flutter feeling and down left arm and into left upper back. Denies additional sx. Tums with no relief.

## 2024-04-05 ENCOUNTER — Other Ambulatory Visit (HOSPITAL_COMMUNITY): Payer: Self-pay

## 2024-04-05 MED ORDER — PANTOPRAZOLE SODIUM 20 MG PO TBEC
20.0000 mg | DELAYED_RELEASE_TABLET | Freq: Every day | ORAL | 0 refills | Status: AC
  Filled 2024-04-05: qty 14, 14d supply, fill #0

## 2024-04-05 MED ORDER — FAMOTIDINE 20 MG PO TABS
20.0000 mg | ORAL_TABLET | Freq: Once | ORAL | Status: AC
  Administered 2024-04-05: 20 mg via ORAL
  Filled 2024-04-05: qty 1

## 2024-04-05 MED ORDER — ACETAMINOPHEN 500 MG PO TABS
1000.0000 mg | ORAL_TABLET | Freq: Once | ORAL | Status: AC
  Administered 2024-04-05: 1000 mg via ORAL
  Filled 2024-04-05: qty 2

## 2024-04-05 NOTE — ED Provider Notes (Signed)
 Care of patient received from prior provider at 4:50 AM, please see their note for complete H/P and care plan.  Received handoff per ED course.  Clinical Course as of 04/05/24 0450  Sun Apr 04, 2024  2304 Stable HO SAG. Chest pain for a week. Worsened today acutely. On ASA daily.  Ddimer pending. HX of APLA gene. [CC]    Clinical Course User Index [CC] Jerral Meth, MD    Reassessment: Chest pain completely resolved on serial assessments.  D-dimer negative, serial troponin flat.  Unlikely to be ACS or PE based on presentation (low Geneva score). Patient ambulatory tolerating p.o. intake on this assessment and feels comfort with outpatient care management.  Referred back to cardiology for ongoing care and management.  Patient also will follow-up with PCP in the interim.      Jerral Meth, MD 04/05/24 [phone]

## 2024-04-07 ENCOUNTER — Other Ambulatory Visit (HOSPITAL_COMMUNITY): Payer: Self-pay

## 2024-04-07 NOTE — Progress Notes (Unsigned)
 Cardiology Office Note   Date:  04/08/2024  ID:  Misty Russell, DOB [birth year], MRN [id number] PCP: Berneta Elsie Sayre, MD  [facility] Providers Cardiologist:  None   History of Present Illness Misty Russell is a 43 y.o. female with a past medical history of anxiety, antiphospholipid antibody positive, migraines.  Presents today for evaluation of chest pain and palpitations.  Patient been seen in the ED on 10/12 complaining of having an intermittent chest burning sensation and palpitations.  Chest pain worsened with p.o. intake and with some positional changes.  Tried taking Tums which did not relieve her symptoms. EKG showed sinus tachycardia with heart rate 107 bpm.  CMP, CBC within normal limits.  High-sensitivity troponin negative x 2.  D-dimer within normal limits.  Chest x-ray with no active cardiopulmonary disease.  She was discharged with instructions to follow-up with cardiology  Today, patient presents for an ED follow-up appointment to establish care with cardiology.  She tells me that for the past 3 weeks or so, she has had intermittent episodes of left-sided chest pain.  Pain seems to come and go randomly and she cannot identify any triggers.  Sometimes is more noticeable when she is eating or drinking but not always.  Notes that her 34-year-old does sleep on that side of her chest, so initially she thought that her chest pain was because of that.  If she rubs the left side of her chest, the pain does not worsen or improve.  On 10/12, she had an episode of fluttering/pounding in her chest.  She also had chest pain with this.  Because of the new fluttering, she decided to go to the ED.  In the ED she was given a GI cocktail.  This does not really help with her chest pain.  She would also prescribed Protonix  which has also not helped relieve her symptoms.  She has not had palpitations since she was seen in the ED.  Has continued to have chest pain.  She denies any shortness  of breath.  She is able to go for walks without shortness of breath.  Patient does not use tobacco products.  She is not exposed to cigarette smoke.  She does not have a history of hypertension, hyperlipidemia, or diabetes.  Denies family history of heart problems  Studies Reviewed  Risk Assessment/Calculations           Physical Exam VS:  BP 112/64   Pulse 74   Ht 5' 4 (1.626 m)   Wt 137 lb (62.1 kg)   LMP 03/16/2024 (Approximate)   SpO2 98%   BMI 23.52 kg/m        Wt Readings from Last 3 Encounters:  04/08/24 137 lb (62.1 kg)  04/04/24 136 lb (61.7 kg)  10/09/23 136 lb 12.8 oz (62.1 kg)    GEN: Well nourished, well developed in no acute distress. Sitting comfortably on the exam table  NECK: No JVD  CARDIAC:  RRR, no murmurs, rubs, gallops. RESPIRATORY:  Clear to auscultation without rales, wheezing or rhonchi. Normal WOB on room air   ABDOMEN: Soft, non-tender, non-distended EXTREMITIES:  No edema in BLE. No deformity   ASSESSMENT AND PLAN  Chest pain  - Seen in the ED 10/12 for intermittent chest burning.  Did not relieve with Tums.  Troponin negative x 2.  D-dimer negative.  EKG nonischemic.  Was given a GI cocktail which did not really help her symptoms - Today, patient presents for evaluation of chest  pain.  Tells me that her chest pain has been intermittent for the past 3 weeks.  Cannot identify any triggers, symptoms occur randomly.  Her 47-year-old does sleep on her chest, wonders if this could be contributing.  However, chest pain is not reproducible on palpation.  Pain does not worsen with exertion - Ordered coronary CTA-creatinine was 0.97 on 10/12 - Ordered echocardiogram - Continue aspirin  81 mg daily (on this for positive antiphospholipid antibodies)  - LDL 102 in 12/2022  Palpitations  - Patient had a rather brief episode of palpitations on 10/12.  She has not had recurrence of palpitations since this time. -EKG in the ED on 10/12 showed normal sinus  rhythm. -Patient has not had palpitations since 10/12.  Denies having palpitations before 10/12 either.  As symptoms have not recurred, we agreed on holding off cardiac monitor for now - Discussed triggers for palpitations including stress, dehydration, caffeine , anxiety    Dispo: Follow up in 3 months with Dr. Elmira (may be able to cancel and follow up PRN if coronary CTA and echo reassuring)   Signed, Rollo FABIENE Louder, PA-C

## 2024-04-08 ENCOUNTER — Other Ambulatory Visit (HOSPITAL_COMMUNITY): Payer: Self-pay

## 2024-04-08 ENCOUNTER — Ambulatory Visit: Attending: Cardiology | Admitting: Cardiology

## 2024-04-08 ENCOUNTER — Encounter: Payer: Self-pay | Admitting: Cardiology

## 2024-04-08 VITALS — BP 112/64 | HR 74 | Ht 64.0 in | Wt 137.0 lb

## 2024-04-08 DIAGNOSIS — R079 Chest pain, unspecified: Secondary | ICD-10-CM

## 2024-04-08 MED ORDER — METOPROLOL TARTRATE 100 MG PO TABS
100.0000 mg | ORAL_TABLET | Freq: Once | ORAL | 0 refills | Status: AC
  Filled 2024-04-08: qty 1, 1d supply, fill #0

## 2024-04-08 NOTE — Patient Instructions (Signed)
 Medication Instructions:   ONE DOSE ONLY:  TAKE  METOPROLOL 100 MG  2 HOURS PRIOR TO  SCHEDULED PROCEDURE   *If you need a refill on your cardiac medications before your next appointment, please call your pharmacy*   Lab Work: NONE ORDERED  TODAY     If you have labs (blood work) drawn today and your tests are completely normal, you will receive your results only by: MyChart Message (if you have MyChart) OR A paper copy in the mail If you have any lab test that is abnormal or we need to change your treatment, we will call you to review the results.  Testing/Procedures: Your physician has requested that you have an echocardiogram. Echocardiography is a painless test that uses sound waves to create images of your heart. It provides your doctor with information about the size and shape of your heart and how well your heart's chambers and valves are working. This procedure takes approximately one hour. There are no restrictions for this procedure. Please do NOT wear cologne, perfume, aftershave, or lotions (deodorant is allowed). Please arrive 15 minutes prior to your appointment time.  Please note: We ask at that you not bring children with you during ultrasound (echo/ vascular) testing. Due to room size and safety concerns, children are not allowed in the ultrasound rooms during exams. Our front office staff cannot provide observation of children in our lobby area while testing is being conducted. An adult accompanying a patient to their appointment will only be allowed in the ultrasound room at the discretion of the ultrasound technician under special circumstances. We apologize for any inconvenience.   Non-Cardiac CT Angiography (CTA), is a special type of CT scan that uses a computer to produce multi-dimensional views of major blood vessels throughout the body. In CT angiography, a contrast material is injected through an IV to help visualize the blood vessels    Follow-Up: At [facility], you and your health needs are our priority.  As part of our continuing mission to provide you with exceptional heart care, our providers are all part of one team.  This team includes your primary Cardiologist (physician) and Advanced Practice Providers or APPs (Physician Assistants and Nurse Practitioners) who all work together to provide you with the care you need, when you need it.  Your next appointment:  3 month(s)  Provider:  DR RITCHIE    We recommend signing up for the patient portal called MyChart.  Sign up information is provided on this After Visit Summary.  MyChart is used to connect with patients for Virtual Visits (Telemedicine).  Patients are able to view lab/test results, encounter notes, upcoming appointments, etc.  Non-urgent messages can be sent to your provider as well.   To learn more about what you can do with MyChart, go to [email or web address].   Other Instructions    Your cardiac CT will be scheduled at one of the below locations:   [facility] [address] [place], [place] [address] [phone] (Severe contrast allergies only)  OR   [facility] [address] [place], [place] [address] [phone]  OR   [facility] [address] [place], [place] [address] [phone]  OR   [facility] [address]  [place], [place] [address] [phone]  OR   [facility] [address] [place], [place] [phone]  If scheduled at [facility], please arrive at the [facility]  and Children's Entrance (Entrance C2) of [facility] 30 minutes prior to test start time. You can use the FREE valet parking offered at entrance C (encouraged to control the heart rate for the test)  Proceed to the [facility] Radiology Department (first floor) to check-in and test prep.  All radiology patients and guests should use entrance C2 at [facility], accessed from [facility], even though the hospital's physical address listed is [address].  If scheduled at the Heart and Vascular Tower at [address], please enter the parking lot using the [address] entrance and use the FREE valet service at the patient drop-off area. Enter the building and check-in with registration on the main floor.  If scheduled at [facility], please arrive to the Heart and Vascular Center 15 mins early for check-in and test prep.  There is spacious parking and easy access to the radiology department from the [facility] Heart and Vascular entrance. Please enter here and check-in with the desk attendant.   If scheduled at [facility], please arrive 30 minutes early for check-in and test prep.  Please follow these instructions carefully (unless otherwise directed):  An IV will be required for this test and Nitroglycerin will be given.    On the Night Before the Test: Be sure to Drink plenty of water. Do not consume any caffeinated/decaffeinated beverages or chocolate 12 hours prior to your test. Do not take any antihistamines 12 hours prior to your test.    On the Day of the Test: Drink plenty of water until 1 hour prior to the test. Do not eat any food 1 hour prior to test. You may take your regular medications prior to the test.  Take metoprolol (Lopressor) two hours prior to test. If you take Furosemide/Hydrochlorothiazide/Spironolactone/Chlorthalidone, please HOLD on the morning of the test. Patients who wear a continuous glucose monitor MUST remove the device prior to scanning. FEMALES- please wear underwire-free bra if available, avoid dresses & tight clothing        After the Test: Drink plenty of water. After receiving IV contrast, you may experience a mild flushed feeling. This is normal. On occasion, you may experience a mild rash up to 24 hours after the test. This is not  dangerous. If this occurs, you can take Benadryl  25 mg, Zyrtec, Claritin, or Allegra and increase your fluid intake. (Patients taking Tikosyn should avoid Benadryl , and may take Zyrtec, Claritin, or Allegra) If you experience trouble breathing, this can be serious. If it is severe call 911 IMMEDIATELY. If it is mild, please call our office.  We will call to schedule your test 2-4 weeks out understanding that some insurance companies will need an authorization prior to the service being performed.   For more information and frequently asked questions, please visit our website : [email or web address]  For non-scheduling related questions, please contact the cardiac imaging nurse navigator should you have any questions/concerns: Cardiac Imaging Nurse Navigators Direct Office Dial: [phone]   For scheduling needs, including cancellations and rescheduling, please call [place], [phone].

## 2024-04-16 DIAGNOSIS — Z1231 Encounter for screening mammogram for malignant neoplasm of breast: Secondary | ICD-10-CM | POA: Diagnosis not present

## 2024-04-20 ENCOUNTER — Other Ambulatory Visit (HOSPITAL_COMMUNITY): Payer: Self-pay

## 2024-04-23 ENCOUNTER — Ambulatory Visit (HOSPITAL_COMMUNITY)

## 2024-04-29 ENCOUNTER — Other Ambulatory Visit (HOSPITAL_COMMUNITY): Payer: Self-pay

## 2024-04-29 ENCOUNTER — Encounter (HOSPITAL_COMMUNITY): Payer: Self-pay

## 2024-04-29 ENCOUNTER — Other Ambulatory Visit: Payer: Self-pay

## 2024-04-30 ENCOUNTER — Other Ambulatory Visit (HOSPITAL_COMMUNITY): Payer: Self-pay

## 2024-04-30 ENCOUNTER — Ambulatory Visit (HOSPITAL_COMMUNITY)
Admission: RE | Admit: 2024-04-30 | Discharge: 2024-04-30 | Disposition: A | Discharge status: Home / Self Care | Source: Ambulatory Visit | Attending: Cardiology | Admitting: Cardiology

## 2024-04-30 DIAGNOSIS — R079 Chest pain, unspecified: Secondary | ICD-10-CM | POA: Insufficient documentation

## 2024-04-30 LAB — ECHOCARDIOGRAM COMPLETE
Area-P 1/2: 3.81 cm2
S' Lateral: 2.9 cm

## 2024-04-30 MED ORDER — IOHEXOL 350 MG/ML SOLN
95.0000 mL | Freq: Once | INTRAVENOUS | Status: AC | PRN
  Administered 2024-04-30: 95 mL via INTRAVENOUS

## 2024-04-30 MED ORDER — METOPROLOL TARTRATE 5 MG/5ML IV SOLN
10.0000 mg | INTRAVENOUS | Status: DC | PRN
  Administered 2024-04-30: 10 mg via INTRAVENOUS

## 2024-04-30 MED ORDER — NITROGLYCERIN 0.4 MG SL SUBL
0.8000 mg | SUBLINGUAL_TABLET | Freq: Once | SUBLINGUAL | Status: AC
  Administered 2024-04-30: 0.8 mg via SUBLINGUAL

## 2024-04-30 MED ORDER — VALACYCLOVIR HCL 500 MG PO TABS
500.0000 mg | ORAL_TABLET | Freq: Every day | ORAL | 3 refills | Status: AC
  Filled 2024-04-30: qty 100, 50d supply, fill #0
  Filled 2024-07-26: qty 200, 100d supply, fill #0

## 2024-04-30 MED ORDER — DILTIAZEM HCL 25 MG/5ML IV SOLN: 10.0000 mg | INTRAVENOUS | Status: DC | PRN

## 2024-05-03 ENCOUNTER — Ambulatory Visit: Payer: Self-pay | Admitting: Cardiology

## 2024-05-06 ENCOUNTER — Other Ambulatory Visit (HOSPITAL_COMMUNITY): Payer: Self-pay

## 2024-05-10 ENCOUNTER — Other Ambulatory Visit (HOSPITAL_COMMUNITY): Payer: Self-pay

## 2024-05-10 MED ORDER — ESCITALOPRAM OXALATE 20 MG PO TABS
20.0000 mg | ORAL_TABLET | Freq: Every day | ORAL | 0 refills | Status: DC
  Filled 2024-05-10: qty 60, 60d supply, fill #0

## 2024-05-24 ENCOUNTER — Other Ambulatory Visit (HOSPITAL_COMMUNITY): Payer: Self-pay

## 2024-05-24 ENCOUNTER — Other Ambulatory Visit: Payer: Self-pay | Admitting: Neurology

## 2024-05-25 ENCOUNTER — Telehealth: Payer: Self-pay | Admitting: Neurology

## 2024-05-25 NOTE — Telephone Encounter (Signed)
 Pt called to request medication refill. topiramate  (TOPAMAX ) 25 MG tablet  Informed Pt that she will need to get a new appt with Different Provider due to  Pt former MD is  not at facility . Pt will wait to speak to nurse about who will be perfect fit for Pt  Medication  is to be sent to  [facility] (Ph: [phone])

## 2024-05-26 ENCOUNTER — Other Ambulatory Visit (HOSPITAL_COMMUNITY): Payer: Self-pay

## 2024-05-26 ENCOUNTER — Other Ambulatory Visit: Payer: Self-pay | Admitting: Neurology

## 2024-05-27 ENCOUNTER — Other Ambulatory Visit (HOSPITAL_COMMUNITY): Payer: Self-pay

## 2024-05-27 MED ORDER — TOPIRAMATE 25 MG PO TABS
25.0000 mg | ORAL_TABLET | Freq: Two times a day (BID) | ORAL | 3 refills | Status: AC
  Filled 2024-05-27: qty 60, 30d supply, fill #0
  Filled 2024-07-26: qty 180, 90d supply, fill #0

## 2024-05-27 NOTE — Telephone Encounter (Signed)
 Call Pt ,No answer  Left detailed Voice mail  that Pt medication is at Pharmacy

## 2024-05-27 NOTE — Telephone Encounter (Signed)
 thanks

## 2024-05-27 NOTE — Telephone Encounter (Signed)
 Pt has called and the message from [place], [place] was relayed.  Pt has scheduled a 1 yr f/u with Dr Buck for 07/02/24 pt's concern is she was told by Dr Ines not to just stop taking the topiramate  (TOPAMAX ) 25 MG tablet  .  She is on day 3 of being without this medication, she des not believe that her PCP will write a Rx for the medication since he did not prescribe it. Since pt has scheduled a f/u and is on wait list is it possible that pt can get a refill on this medication to the [facility] .  Pt does not want to experience a cluster headache, please call.

## 2024-05-27 NOTE — Telephone Encounter (Signed)
 Topamax  25 mg twice daily refilled.

## 2024-06-15 ENCOUNTER — Other Ambulatory Visit (HOSPITAL_COMMUNITY): Payer: Self-pay

## 2024-06-15 ENCOUNTER — Other Ambulatory Visit: Payer: Self-pay

## 2024-06-15 DIAGNOSIS — F4312 Post-traumatic stress disorder, chronic: Secondary | ICD-10-CM | POA: Diagnosis not present

## 2024-06-15 DIAGNOSIS — F411 Generalized anxiety disorder: Secondary | ICD-10-CM | POA: Diagnosis not present

## 2024-06-15 DIAGNOSIS — F331 Major depressive disorder, recurrent, moderate: Secondary | ICD-10-CM | POA: Diagnosis not present

## 2024-06-15 MED ORDER — ESCITALOPRAM OXALATE 20 MG PO TABS
20.0000 mg | ORAL_TABLET | Freq: Every day | ORAL | 0 refills | Status: AC
  Filled 2024-06-15 – 2024-07-09 (×2): qty 90, 90d supply, fill #0

## 2024-06-15 MED ORDER — BUSPIRONE HCL 7.5 MG PO TABS
7.5000 mg | ORAL_TABLET | Freq: Three times a day (TID) | ORAL | 0 refills | Status: AC
  Filled 2024-06-15: qty 270, 90d supply, fill #0

## 2024-07-02 ENCOUNTER — Ambulatory Visit: Admitting: Neurology

## 2024-07-02 ENCOUNTER — Encounter: Payer: Self-pay | Admitting: Neurology

## 2024-07-09 ENCOUNTER — Encounter (HOSPITAL_COMMUNITY): Payer: Self-pay | Admitting: Pharmacist

## 2024-07-09 ENCOUNTER — Other Ambulatory Visit (HOSPITAL_COMMUNITY): Payer: Self-pay

## 2024-07-12 ENCOUNTER — Other Ambulatory Visit (HOSPITAL_COMMUNITY): Payer: Self-pay

## 2024-07-26 ENCOUNTER — Other Ambulatory Visit: Payer: Self-pay

## 2024-07-26 ENCOUNTER — Other Ambulatory Visit (HOSPITAL_COMMUNITY): Payer: Self-pay

## 2024-12-30 ENCOUNTER — Encounter: Admitting: Family Medicine
